# Patient Record
Sex: Male | Born: 1955 | Race: White | Hispanic: No | Marital: Single | Smoking: Current every day smoker
Health system: Southern US, Community
[De-identification: ages and names within clinical notes are randomized; demographics above are authoritative.]

## PROBLEM LIST (undated history)

## (undated) DIAGNOSIS — I639 Cerebral infarction, unspecified: Secondary | ICD-10-CM

## (undated) DIAGNOSIS — L4050 Arthropathic psoriasis, unspecified: Secondary | ICD-10-CM

## (undated) DIAGNOSIS — K219 Gastro-esophageal reflux disease without esophagitis: Secondary | ICD-10-CM

## (undated) DIAGNOSIS — D369 Benign neoplasm, unspecified site: Secondary | ICD-10-CM

## (undated) DIAGNOSIS — J449 Chronic obstructive pulmonary disease, unspecified: Secondary | ICD-10-CM

## (undated) DIAGNOSIS — E785 Hyperlipidemia, unspecified: Secondary | ICD-10-CM

## (undated) DIAGNOSIS — I10 Essential (primary) hypertension: Secondary | ICD-10-CM

## (undated) DIAGNOSIS — J189 Pneumonia, unspecified organism: Secondary | ICD-10-CM

## (undated) DIAGNOSIS — E039 Hypothyroidism, unspecified: Secondary | ICD-10-CM

## (undated) HISTORY — DX: Hyperlipidemia, unspecified: E78.5

## (undated) HISTORY — DX: Hypothyroidism, unspecified: E03.9

## (undated) HISTORY — DX: Chronic obstructive pulmonary disease, unspecified: J44.9

## (undated) HISTORY — DX: Arthropathic psoriasis, unspecified: L40.50

## (undated) HISTORY — DX: Essential (primary) hypertension: I10

## (undated) HISTORY — DX: Gastro-esophageal reflux disease without esophagitis: K21.9

## (undated) HISTORY — DX: Cerebral infarction, unspecified: I63.9

## (undated) HISTORY — DX: Benign neoplasm, unspecified site: D36.9

## (undated) HISTORY — DX: Pneumonia, unspecified organism: J18.9

## (undated) HISTORY — PX: OTHER SURGICAL HISTORY: SHX169

---

## 1999-05-05 ENCOUNTER — Encounter: Admission: RE | Admit: 1999-05-05 | Discharge: 1999-05-05 | Payer: Self-pay | Admitting: Internal Medicine

## 1999-05-05 ENCOUNTER — Encounter: Payer: Self-pay | Admitting: Internal Medicine

## 1999-05-08 ENCOUNTER — Encounter: Payer: Self-pay | Admitting: Internal Medicine

## 1999-05-08 ENCOUNTER — Encounter: Admission: RE | Admit: 1999-05-08 | Discharge: 1999-05-08 | Payer: Self-pay | Admitting: Internal Medicine

## 1999-05-19 ENCOUNTER — Encounter: Payer: Self-pay | Admitting: Internal Medicine

## 1999-05-19 ENCOUNTER — Encounter: Admission: RE | Admit: 1999-05-19 | Discharge: 1999-05-19 | Payer: Self-pay | Admitting: Internal Medicine

## 1999-06-23 ENCOUNTER — Encounter: Payer: Self-pay | Admitting: Internal Medicine

## 1999-06-23 ENCOUNTER — Encounter: Admission: RE | Admit: 1999-06-23 | Discharge: 1999-06-23 | Payer: Self-pay | Admitting: Internal Medicine

## 2001-06-27 ENCOUNTER — Encounter: Payer: Self-pay | Admitting: Internal Medicine

## 2001-06-27 ENCOUNTER — Encounter: Admission: RE | Admit: 2001-06-27 | Discharge: 2001-06-27 | Payer: Self-pay | Admitting: Internal Medicine

## 2002-08-30 ENCOUNTER — Encounter: Admission: RE | Admit: 2002-08-30 | Discharge: 2002-08-30 | Payer: Self-pay | Admitting: Internal Medicine

## 2002-08-30 ENCOUNTER — Encounter: Payer: Self-pay | Admitting: Internal Medicine

## 2003-12-04 ENCOUNTER — Encounter: Admission: RE | Admit: 2003-12-04 | Discharge: 2003-12-04 | Payer: Self-pay | Admitting: Internal Medicine

## 2005-08-10 ENCOUNTER — Ambulatory Visit: Payer: Self-pay | Admitting: Internal Medicine

## 2005-08-24 ENCOUNTER — Encounter: Payer: Self-pay | Admitting: Internal Medicine

## 2005-08-24 ENCOUNTER — Ambulatory Visit: Payer: Self-pay | Admitting: Internal Medicine

## 2008-01-23 ENCOUNTER — Ambulatory Visit: Payer: Self-pay | Admitting: Internal Medicine

## 2008-07-30 ENCOUNTER — Ambulatory Visit: Payer: Self-pay | Admitting: Internal Medicine

## 2009-01-03 ENCOUNTER — Ambulatory Visit: Payer: Self-pay | Admitting: Internal Medicine

## 2009-02-04 ENCOUNTER — Ambulatory Visit: Payer: Self-pay | Admitting: Internal Medicine

## 2009-02-04 ENCOUNTER — Encounter: Admission: RE | Admit: 2009-02-04 | Discharge: 2009-02-04 | Payer: Self-pay | Admitting: Internal Medicine

## 2009-08-05 ENCOUNTER — Ambulatory Visit: Payer: Self-pay | Admitting: Internal Medicine

## 2010-02-10 ENCOUNTER — Other Ambulatory Visit: Payer: 59 | Admitting: Internal Medicine

## 2010-02-17 ENCOUNTER — Encounter (INDEPENDENT_AMBULATORY_CARE_PROVIDER_SITE_OTHER): Payer: 59 | Admitting: Internal Medicine

## 2010-02-17 DIAGNOSIS — I10 Essential (primary) hypertension: Secondary | ICD-10-CM

## 2010-02-17 DIAGNOSIS — E039 Hypothyroidism, unspecified: Secondary | ICD-10-CM

## 2010-02-17 DIAGNOSIS — E785 Hyperlipidemia, unspecified: Secondary | ICD-10-CM

## 2010-08-15 ENCOUNTER — Encounter: Payer: Self-pay | Admitting: Internal Medicine

## 2010-08-18 ENCOUNTER — Other Ambulatory Visit: Payer: BC Managed Care – PPO | Admitting: Internal Medicine

## 2010-08-18 DIAGNOSIS — E039 Hypothyroidism, unspecified: Secondary | ICD-10-CM

## 2010-08-18 DIAGNOSIS — E785 Hyperlipidemia, unspecified: Secondary | ICD-10-CM

## 2010-08-19 LAB — LIPID PANEL
HDL: 45 mg/dL (ref >39)
LDL Cholesterol: 99 mg/dL (ref 0–99)
Total CHOL/HDL Ratio: 3.5 Ratio
Triglycerides: 70 mg/dL (ref <150)
VLDL: 14 mg/dL (ref 0–40)

## 2010-08-19 LAB — HEPATIC FUNCTION PANEL
Albumin: 4.4 g/dL (ref 3.5–5.2)
Total Protein: 6.7 g/dL (ref 6.0–8.3)

## 2010-08-19 LAB — TSH: TSH: 0.728 u[IU]/mL (ref 0.350–4.500)

## 2010-08-25 ENCOUNTER — Encounter: Payer: Self-pay | Admitting: Internal Medicine

## 2010-08-25 ENCOUNTER — Ambulatory Visit (INDEPENDENT_AMBULATORY_CARE_PROVIDER_SITE_OTHER): Payer: BC Managed Care – PPO | Admitting: Internal Medicine

## 2010-08-25 VITALS — BP 108/72 | HR 80 | Temp 98.4°F | Ht 65.5 in | Wt 126.0 lb

## 2010-08-25 DIAGNOSIS — E785 Hyperlipidemia, unspecified: Secondary | ICD-10-CM

## 2010-08-25 DIAGNOSIS — I10 Essential (primary) hypertension: Secondary | ICD-10-CM

## 2010-08-25 DIAGNOSIS — E039 Hypothyroidism, unspecified: Secondary | ICD-10-CM | POA: Insufficient documentation

## 2010-08-25 NOTE — Patient Instructions (Signed)
Continue same medications as previously prescribed. Return in 6 months for physical exam and fasting labs

## 2010-08-25 NOTE — Progress Notes (Signed)
  Subjective:    Patient ID: Sean Weaver, male    DOB: Apr 23, 1955, 55 y.o.   MRN: [id number]  HPI  patient with history of hypertension, hyperlipidemia, and hypothyroidism. History of erectile dysfunction treated with Cialis. In today for six-month recheck.    Review of Systems     Objective:   Physical Exam neck is supple without thyromegaly or carotid bruits; chest is clear; cardiac exam: Regular rate and rhythm, normal S1 and S2; extremities without edema        Assessment & Plan:  Hypertension  Hyperlipidemia  Hypothyroidism  Erectile dysfunction  Plan: New prescription for Cialis daily 5 mg with when necessary 1 year refill. Okay to refill Synthroid, generic Lipitor, generic Altace prn 6 months if drugstore calls. Schedule physical exam in 6 months. Fasting lipid panel, liver functions and TSH all within normal limits and reviewed with patient today

## 2010-10-04 ENCOUNTER — Other Ambulatory Visit: Payer: Self-pay | Admitting: Internal Medicine

## 2010-10-13 ENCOUNTER — Other Ambulatory Visit: Payer: Self-pay | Admitting: Internal Medicine

## 2011-03-02 ENCOUNTER — Other Ambulatory Visit: Payer: BC Managed Care – PPO | Admitting: Internal Medicine

## 2011-03-09 ENCOUNTER — Encounter: Payer: BC Managed Care – PPO | Admitting: Internal Medicine

## 2011-03-30 ENCOUNTER — Other Ambulatory Visit: Payer: BC Managed Care – PPO | Admitting: Internal Medicine

## 2011-03-30 DIAGNOSIS — E785 Hyperlipidemia, unspecified: Secondary | ICD-10-CM

## 2011-03-30 DIAGNOSIS — E039 Hypothyroidism, unspecified: Secondary | ICD-10-CM

## 2011-03-30 DIAGNOSIS — Z0000 Encounter for general adult medical examination without abnormal findings: Secondary | ICD-10-CM

## 2011-03-30 DIAGNOSIS — I10 Essential (primary) hypertension: Secondary | ICD-10-CM

## 2011-03-30 LAB — CBC WITH DIFFERENTIAL/PLATELET
Basophils Absolute: 0 10*3/uL (ref 0.0–0.1)
Lymphocytes Relative: 26 % (ref 12–46)
Lymphs Abs: 2.1 10*3/uL (ref 0.7–4.0)
Neutrophils Relative %: 64 % (ref 43–77)
Platelets: 245 10*3/uL (ref 150–400)
RBC: 5.32 MIL/uL (ref 4.22–5.81)
RDW: 14.4 % (ref 11.5–15.5)
WBC: 8 10*3/uL (ref 4.0–10.5)

## 2011-03-30 LAB — LIPID PANEL
Cholesterol: 169 mg/dL (ref 0–200)
HDL: 45 mg/dL (ref >39)
LDL Cholesterol: 113 mg/dL — ABNORMAL HIGH (ref 0–99)
Total CHOL/HDL Ratio: 3.8 ratio
Triglycerides: 55 mg/dL (ref <150)
VLDL: 11 mg/dL (ref 0–40)

## 2011-03-30 LAB — COMPREHENSIVE METABOLIC PANEL WITH GFR
ALT: 12 U/L (ref 0–53)
AST: 15 U/L (ref 0–37)
Albumin: 4.4 g/dL (ref 3.5–5.2)
Alkaline Phosphatase: 82 U/L (ref 39–117)
BUN: 21 mg/dL (ref 6–23)
CO2: 25 meq/L (ref 19–32)
Calcium: 9 mg/dL (ref 8.4–10.5)
Chloride: 105 meq/L (ref 96–112)
Creat: 1.01 mg/dL (ref 0.50–1.35)
Glucose, Bld: 91 mg/dL (ref 70–99)
Potassium: 4.9 meq/L (ref 3.5–5.3)
Sodium: 139 meq/L (ref 135–145)
Total Bilirubin: 0.5 mg/dL (ref 0.3–1.2)
Total Protein: 6.4 g/dL (ref 6.0–8.3)

## 2011-03-30 LAB — PSA: PSA: 0.87 ng/mL (ref <=4.00)

## 2011-04-06 ENCOUNTER — Ambulatory Visit (INDEPENDENT_AMBULATORY_CARE_PROVIDER_SITE_OTHER): Payer: BC Managed Care – PPO | Admitting: Internal Medicine

## 2011-04-06 ENCOUNTER — Encounter: Payer: Self-pay | Admitting: Internal Medicine

## 2011-04-06 VITALS — BP 108/68 | HR 80 | Temp 97.5°F | Ht 65.25 in | Wt 130.0 lb

## 2011-04-06 DIAGNOSIS — E785 Hyperlipidemia, unspecified: Secondary | ICD-10-CM

## 2011-04-06 DIAGNOSIS — K219 Gastro-esophageal reflux disease without esophagitis: Secondary | ICD-10-CM

## 2011-04-06 DIAGNOSIS — N529 Male erectile dysfunction, unspecified: Secondary | ICD-10-CM

## 2011-04-06 DIAGNOSIS — R768 Other specified abnormal immunological findings in serum: Secondary | ICD-10-CM

## 2011-04-06 DIAGNOSIS — Z23 Encounter for immunization: Secondary | ICD-10-CM

## 2011-04-06 DIAGNOSIS — E039 Hypothyroidism, unspecified: Secondary | ICD-10-CM

## 2011-04-06 DIAGNOSIS — D369 Benign neoplasm, unspecified site: Secondary | ICD-10-CM

## 2011-04-06 DIAGNOSIS — R894 Abnormal immunological findings in specimens from other organs, systems and tissues: Secondary | ICD-10-CM

## 2011-04-06 DIAGNOSIS — L409 Psoriasis, unspecified: Secondary | ICD-10-CM

## 2011-04-06 DIAGNOSIS — Z0000 Encounter for general adult medical examination without abnormal findings: Secondary | ICD-10-CM

## 2011-04-06 DIAGNOSIS — Z8739 Personal history of other diseases of the musculoskeletal system and connective tissue: Secondary | ICD-10-CM

## 2011-04-06 DIAGNOSIS — I10 Essential (primary) hypertension: Secondary | ICD-10-CM

## 2011-04-06 DIAGNOSIS — Z872 Personal history of diseases of the skin and subcutaneous tissue: Secondary | ICD-10-CM

## 2011-04-06 DIAGNOSIS — Z87891 Personal history of nicotine dependence: Secondary | ICD-10-CM

## 2011-04-06 DIAGNOSIS — L408 Other psoriasis: Secondary | ICD-10-CM

## 2011-04-06 LAB — POCT URINALYSIS DIPSTICK
Protein, UA: NEGATIVE
Spec Grav, UA: <=1.005
Urobilinogen, UA: NEGATIVE
pH, UA: 6.5

## 2011-05-03 DIAGNOSIS — L409 Psoriasis, unspecified: Secondary | ICD-10-CM | POA: Insufficient documentation

## 2011-05-03 DIAGNOSIS — K219 Gastro-esophageal reflux disease without esophagitis: Secondary | ICD-10-CM | POA: Insufficient documentation

## 2011-05-03 DIAGNOSIS — Z872 Personal history of diseases of the skin and subcutaneous tissue: Secondary | ICD-10-CM | POA: Insufficient documentation

## 2011-05-03 DIAGNOSIS — R768 Other specified abnormal immunological findings in serum: Secondary | ICD-10-CM | POA: Insufficient documentation

## 2011-05-03 DIAGNOSIS — N529 Male erectile dysfunction, unspecified: Secondary | ICD-10-CM | POA: Insufficient documentation

## 2011-05-03 DIAGNOSIS — Z87891 Personal history of nicotine dependence: Secondary | ICD-10-CM | POA: Insufficient documentation

## 2011-05-03 DIAGNOSIS — D369 Benign neoplasm, unspecified site: Secondary | ICD-10-CM | POA: Insufficient documentation

## 2011-05-03 NOTE — Patient Instructions (Signed)
Continue same medications and return in 6 months 
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
## 2011-05-14 ENCOUNTER — Telehealth: Payer: Self-pay | Admitting: Internal Medicine

## 2011-05-14 NOTE — Telephone Encounter (Signed)
Patient has long-standing history of smoking. He will to try Wellbutrin for smoking cessation.  We will start with Wellbutrin XL 150 mg daily. He does not want to try Chantix because of other friends of his have had adverse reactions. In 4 weeks we can increase to Wellbutrin XL 300 mg daily if tolerated. Prescription for Wellbutrin XL 150 mg #30 written

## 2011-07-27 ENCOUNTER — Ambulatory Visit (INDEPENDENT_AMBULATORY_CARE_PROVIDER_SITE_OTHER): Payer: BC Managed Care – PPO | Admitting: Internal Medicine

## 2011-07-27 ENCOUNTER — Encounter: Payer: Self-pay | Admitting: Internal Medicine

## 2011-07-27 VITALS — BP 118/74 | HR 76 | Temp 98.0°F | Ht 65.5 in | Wt 134.0 lb

## 2011-07-27 DIAGNOSIS — E785 Hyperlipidemia, unspecified: Secondary | ICD-10-CM

## 2011-07-27 DIAGNOSIS — Z872 Personal history of diseases of the skin and subcutaneous tissue: Secondary | ICD-10-CM

## 2011-07-27 DIAGNOSIS — L409 Psoriasis, unspecified: Secondary | ICD-10-CM

## 2011-07-27 DIAGNOSIS — M79674 Pain in right toe(s): Secondary | ICD-10-CM

## 2011-07-27 DIAGNOSIS — I878 Other specified disorders of veins: Secondary | ICD-10-CM

## 2011-07-27 DIAGNOSIS — Z87891 Personal history of nicotine dependence: Secondary | ICD-10-CM

## 2011-07-27 DIAGNOSIS — I10 Essential (primary) hypertension: Secondary | ICD-10-CM

## 2011-07-27 DIAGNOSIS — M79609 Pain in unspecified limb: Secondary | ICD-10-CM

## 2011-07-27 DIAGNOSIS — E039 Hypothyroidism, unspecified: Secondary | ICD-10-CM

## 2011-07-27 DIAGNOSIS — L408 Other psoriasis: Secondary | ICD-10-CM

## 2011-07-27 DIAGNOSIS — N529 Male erectile dysfunction, unspecified: Secondary | ICD-10-CM

## 2011-07-27 DIAGNOSIS — M79673 Pain in unspecified foot: Secondary | ICD-10-CM

## 2011-07-27 NOTE — Progress Notes (Signed)
  Subjective:    Patient ID: Sean Weaver, male    DOB: [birth year], 56 y.o.   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 Says it's been sore and irritated for a couple of weeks. Last week he was on vacation so he got some rest and wasn't standing on his feet. Also complaining of pain in right toe. Thinks he may have a hair splinter there. This is common with hairdresser's apparently. Tender along the dorsum of the great toe. No erythema. Says it feels like something is in his toe. He has a history of psoriasis and psoriatic arthritis. Also history of hypertension, hyperlipidemia, and hypothyroidism. History of heavy smoking for years. He cut down recently to 6 cigarettes daily a Wellbutrin which is great.      Review of Systems     Objective:   Physical Exam At the insertion of right Achilles tendon, there is a spongy feeling to palpation with some tenderness. There is some discoloration along the lateral ankle that blanches. It disappears when he is reclining. Suspect this has to do with stasis. Tender dorsum of great toe lateral aspect. There is actually some point tenderness. It does appear to be slightly swollen. No redness or drainage. He is very 10. Psoriasis is virtually disappeared except for area on left elbow        Assessment & Plan:  Heel pain-possible Achilles tendinitis (right)  Possible foreign body in great toe-(right)  Venous stasis  History of psoriasis   History of psoriatic arthritis  History of smoking  Hypothyroidism  Hypertension   Hyperlipidemia  Plan: For tendinitis was given Sterapred DS 10 mg 6 day dosepak. For possible foreign body in toe, given Cefzil 500 mg twice daily for 10 days. He is to soak toe and ankle in warm Epson salts water 20 minutes daily. If pain in toe persists, consider limited MRI. If pain in heel persists, refer to orthopedist.

## 2011-07-27 NOTE — Patient Instructions (Addendum)
Soak right foot in warm Epsom salts water 20 minutes daily. Take Sterapred dosepak as prescribed for 6 days. Take Cefzil 500 mg twice daily for 10 days. Call if not better in 2 weeks or sooner if worse.

## 2011-08-03 ENCOUNTER — Encounter (HOSPITAL_COMMUNITY): Payer: Self-pay | Admitting: Emergency Medicine

## 2011-08-03 ENCOUNTER — Emergency Department (HOSPITAL_COMMUNITY)
Admission: EM | Admit: 2011-08-03 | Discharge: 2011-08-03 | Disposition: A | Discharge status: Home / Self Care | Payer: BC Managed Care – PPO | Attending: Emergency Medicine | Admitting: Emergency Medicine

## 2011-08-03 DIAGNOSIS — Y93H9 Activity, other involving exterior property and land maintenance, building and construction: Secondary | ICD-10-CM | POA: Insufficient documentation

## 2011-08-03 DIAGNOSIS — I10 Essential (primary) hypertension: Secondary | ICD-10-CM | POA: Insufficient documentation

## 2011-08-03 DIAGNOSIS — E079 Disorder of thyroid, unspecified: Secondary | ICD-10-CM | POA: Insufficient documentation

## 2011-08-03 DIAGNOSIS — S058X9A Other injuries of unspecified eye and orbit, initial encounter: Secondary | ICD-10-CM | POA: Insufficient documentation

## 2011-08-03 DIAGNOSIS — S01112A Laceration without foreign body of left eyelid and periocular area, initial encounter: Secondary | ICD-10-CM

## 2011-08-03 DIAGNOSIS — F17200 Nicotine dependence, unspecified, uncomplicated: Secondary | ICD-10-CM | POA: Insufficient documentation

## 2011-08-03 DIAGNOSIS — K219 Gastro-esophageal reflux disease without esophagitis: Secondary | ICD-10-CM | POA: Insufficient documentation

## 2011-08-03 DIAGNOSIS — E785 Hyperlipidemia, unspecified: Secondary | ICD-10-CM | POA: Insufficient documentation

## 2011-08-03 DIAGNOSIS — W268XXA Contact with other sharp object(s), not elsewhere classified, initial encounter: Secondary | ICD-10-CM | POA: Insufficient documentation

## 2011-08-03 DIAGNOSIS — Y998 Other external cause status: Secondary | ICD-10-CM | POA: Insufficient documentation

## 2011-08-03 NOTE — ED Provider Notes (Signed)
Medical screening examination/treatment/procedure(s) were performed by non-physician practitioner and as supervising physician I was immediately available for consultation/collaboration.    Nelia Shi, MD 08/03/11 [phone]

## 2011-08-03 NOTE — ED Provider Notes (Signed)
History     CSN: [id number]  Arrival date & time 08/03/11  1220   First MD Initiated Contact with Patient 08/03/11 1230      Chief Complaint  Patient presents with  . Laceration    (Consider location/radiation/quality/duration/timing/severity/associated sxs/prior treatment) HPI  56 year old male presents complaining of left-eyelid injury. Patient states he was trimming bushes this afternoon when a branch hits his left upper eyelid, causing a cut. He denies any significant pain to his eye, vision changes, or pain with eye movement. Patient is not on any blood thinner medication. He is up-to-date with his tetanus shot. He denies any significant pain to the affected site.  Past Medical History  Diagnosis Date  . GERD (gastroesophageal reflux disease)   . Hypertension   . Thyroid disease   . Hyperlipidemia   . Tubular adenoma     History reviewed. No pertinent past surgical history.  Family History  Problem Relation Age of Onset  . Cancer Mother   . Cancer Father   . Hypertension Sister     History  Substance Use Topics  . Smoking status: Current Everyday Smoker  . Smokeless tobacco: Not on file  . Alcohol Use: Yes     rarely      Review of Systems  Constitutional: Negative for fever.  Eyes: Negative for photophobia, pain, discharge, redness and visual disturbance.  Skin: Positive for wound.    Allergies  Aspirin and Ibuprofen  Home Medications   Current Outpatient Rx  Name Route Sig Dispense Refill  . ATORVASTATIN CALCIUM 20 MG PO TABS Oral Take 20 mg by mouth daily.      . BUPROPION HCL ER (SR) 150 MG PO TB12 Oral Take 150 mg by mouth every other day.     Marland Kitchen CEFPROZIL 500 MG PO TABS Oral Take 500 mg by mouth 2 (two) times daily.    Marland Kitchen PREDNISONE 10 MG PO TABS Oral Take 10 mg by mouth daily.    Marland Kitchen RAMIPRIL 2.5 MG PO CAPS Oral Take 2.5 mg by mouth daily.    Marland Kitchen TADALAFIL 20 MG PO TABS Oral Take 20 mg by mouth daily as needed. Erectile dysfunction      BP  137/69  Pulse 72  Temp 99 F (37.2 C) (Oral)  Resp 20  SpO2 98%  Physical Exam  Nursing note and vitals reviewed. Constitutional: He is oriented to person, place, and time. He appears well-developed and well-nourished. No distress.  HENT:  Head: Normocephalic.  Mouth/Throat: Oropharynx is clear and moist.  Eyes: Conjunctivae and EOM are normal. Pupils are equal, round, and reactive to light. No foreign bodies found. Right eye exhibits no chemosis, no discharge and no exudate. No foreign body present in the right eye. Left eye exhibits no chemosis, no discharge and no exudate. No foreign body present in the left eye. No scleral icterus.    Neck: Normal range of motion. Neck supple.  Neurological: He is alert and oriented to person, place, and time.  Skin: Skin is warm. No rash noted.  Psychiatric: He has a normal mood and affect.    ED Course  Procedures (including critical care time)  Labs Reviewed - No data to display No results found.   No diagnosis found.  LACERATION REPAIR Performed by: Fayrene Helper Authorized byFayrene Helper Consent: Verbal consent obtained. Risks and benefits: risks, benefits and alternatives were discussed Consent given by: patient Patient identity confirmed: provided demographic data Prepped and Draped in normal sterile fashion Wound explored  Laceration  Location: L upper eyelid  Laceration Length: 1cm  No Foreign Bodies seen or palpated  Anesthesia: n/a  Local anesthetic: none  Anesthetic total: n/a  Irrigation method: q-tip Amount of cleaning: standard  Skin closure: delay  Number of sutures: n/a  Technique: bacitracin applied  Patient tolerance: Patient tolerated the procedure well with no immediate complications.  1. Left upper eye lid laceration.    MDM  Superficial laceration to L upper eyelid without eye involvement.  Since pt blink often and the wound is not actively bleeding, we opted to clean wound, apply bacitracin  and allow for it to heal on its own.  No dermabond, sterile strip or suture used.  Pt amenable to plan.          Fayrene Helper, PA-C 08/03/11 1326

## 2011-08-03 NOTE — ED Notes (Signed)
Pt presenting to ed with c/o laceration to left eye. Pt states he was out doing yard work and a limb cut him above his left eye. Pt denies blurred vision, nausea and vomiting at this time

## 2011-08-13 ENCOUNTER — Encounter: Payer: Self-pay | Admitting: Internal Medicine

## 2011-08-13 ENCOUNTER — Ambulatory Visit (INDEPENDENT_AMBULATORY_CARE_PROVIDER_SITE_OTHER): Payer: BC Managed Care – PPO | Admitting: Internal Medicine

## 2011-08-13 VITALS — BP 112/76 | HR 80 | Ht 65.5 in | Wt 134.0 lb

## 2011-08-13 DIAGNOSIS — S058X9A Other injuries of unspecified eye and orbit, initial encounter: Secondary | ICD-10-CM

## 2011-08-13 DIAGNOSIS — S01119A Laceration without foreign body of unspecified eyelid and periocular area, initial encounter: Secondary | ICD-10-CM

## 2011-09-03 ENCOUNTER — Encounter: Payer: Self-pay | Admitting: Internal Medicine

## 2011-09-03 NOTE — Progress Notes (Signed)
  Subjective:    Patient ID: TIERNAN SUTO, male    DOB: 01/03/1956, 56 y.o.   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 Had no loss of consciousness. No head injury. Patient called here and was referred to emergency department because it was felt he might have and orbit injury and would need for emergency assistance. However the laceration proved to be superficial. He required sutures and orbit was not injured.    Review of Systems     Objective:   Physical Exam healing superficial laceration upper eyelid without evidence of secondary infection        Assessment & Plan:  Superficial eyelid laceration  Hypertension-stable  Hyperlipidemia-being treated  Hypothyroidism-being treated  Plan: Return for routine health maintenance as previously scheduled. Continue to treat healing laceration with peroxide twice daily and antibiotic ointment until healed. Tetanus immunization is up-to-date.

## 2011-09-03 NOTE — Patient Instructions (Addendum)
Continue to clean laceration with peroxide and apply triple antibiotic ointment until healed

## 2011-09-24 ENCOUNTER — Encounter: Payer: Self-pay | Admitting: Internal Medicine

## 2011-10-12 ENCOUNTER — Encounter: Payer: Self-pay | Admitting: Internal Medicine

## 2011-10-12 ENCOUNTER — Ambulatory Visit (INDEPENDENT_AMBULATORY_CARE_PROVIDER_SITE_OTHER): Payer: BC Managed Care – PPO | Admitting: Internal Medicine

## 2011-10-12 ENCOUNTER — Other Ambulatory Visit: Payer: Self-pay | Admitting: Internal Medicine

## 2011-10-12 VITALS — BP 106/74 | HR 80 | Temp 98.0°F | Wt 133.5 lb

## 2011-10-12 DIAGNOSIS — R768 Other specified abnormal immunological findings in serum: Secondary | ICD-10-CM

## 2011-10-12 DIAGNOSIS — Z8739 Personal history of other diseases of the musculoskeletal system and connective tissue: Secondary | ICD-10-CM

## 2011-10-12 DIAGNOSIS — Z87891 Personal history of nicotine dependence: Secondary | ICD-10-CM

## 2011-10-12 DIAGNOSIS — I10 Essential (primary) hypertension: Secondary | ICD-10-CM

## 2011-10-12 DIAGNOSIS — Z23 Encounter for immunization: Secondary | ICD-10-CM

## 2011-10-12 DIAGNOSIS — Z79899 Other long term (current) drug therapy: Secondary | ICD-10-CM

## 2011-10-12 DIAGNOSIS — J069 Acute upper respiratory infection, unspecified: Secondary | ICD-10-CM

## 2011-10-12 DIAGNOSIS — E039 Hypothyroidism, unspecified: Secondary | ICD-10-CM

## 2011-10-12 DIAGNOSIS — E785 Hyperlipidemia, unspecified: Secondary | ICD-10-CM

## 2011-10-12 DIAGNOSIS — N529 Male erectile dysfunction, unspecified: Secondary | ICD-10-CM

## 2011-10-12 DIAGNOSIS — Z872 Personal history of diseases of the skin and subcutaneous tissue: Secondary | ICD-10-CM

## 2011-10-12 DIAGNOSIS — R894 Abnormal immunological findings in specimens from other organs, systems and tissues: Secondary | ICD-10-CM

## 2011-10-12 LAB — TSH: TSH: 0.64 u[IU]/mL (ref 0.350–4.500)

## 2011-10-12 LAB — LIPID PANEL
LDL Cholesterol: 114 mg/dL — ABNORMAL HIGH (ref 0–99)
Total CHOL/HDL Ratio: 3.6 Ratio
Triglycerides: 49 mg/dL (ref <150)
VLDL: 10 mg/dL (ref 0–40)

## 2011-10-12 LAB — HEPATIC FUNCTION PANEL
Indirect Bilirubin: 0.6 mg/dL (ref 0.0–0.9)
Total Protein: 6.6 g/dL (ref 6.0–8.3)

## 2011-10-12 NOTE — Patient Instructions (Addendum)
Continue same medications. Zithromax Z-PAK as been prescribed for respiratory infection symptoms. Return in 6 months. Lab work is pending

## 2011-10-12 NOTE — Progress Notes (Signed)
  Subjective:    Patient ID: Sean Weaver, male    DOB: July 02, 1955, 56 y.o.   MRN: [id number]  HPI 56 year old white male with history of hypertension, hypothyroidism, hyperlipidemia in today for six-month recheck. History of erectile dysfunction and uses either Viagra or Cialis but these tend to cause a headache. History of heavy smoking. Not ready to quit. Fasting lipid panel liver functions drawn today along with TSH. He is on statin medication. Influenza immunization given today.  Today he has new complaint of sore throat and URI symptoms with postnasal drip. No fever or shaking chills.  He works as a Retail buyer at [employer]. Lives for long-term male partner of many years.  History of colonoscopy in 2007 with history of adenomatous colon polyp. Hepatitis B surface antibody positive. No evidence of chronic hepatitis B infection. History of psoriatic arthritis. Takes Wellbutrin to help with smoking cessation efforts.     Review of Systems     Objective:   Physical Exam pharynx is red without exudate. TMs are clear bilaterally. Neck is supple without thyromegaly or adenopathy. Chest clear to auscultation. Cardiac exam regular rate and rhythm. Extremities without edema. Skin is warm and dry. No hepatosplenomegaly masses or tenderness        Assessment & Plan:  URI-treat with Zithromax Z-PAK take 2 tablets by mouth day one followed by 1 tablet by mouth days 2 through 5  Hypothyroidism TSH is drawn and is pending on levothyroxin 0.1 mg daily  Hyperlipidemia-fasting lipid panel liver functions drawn on Lipitor generic  Hypertension-stable on Ramipril  History of smoking-not ready to quit  History of psoriatic arthritis-stable and not active at present time  History of psoriasis  History of hepatitis B surface antibody positive with no evidence of chronic hepatitis.  History of adenomatous colon polyp  History of erectile dysfunction-prescription  written for sample of Cialis 5 mg daily #30  Plan: Return in 6 months for physical examination. Influenza immunization given today.

## 2011-10-12 NOTE — Addendum Note (Signed)
Addended by: Judy Pimple on: 10/12/2011 11:46 AM   Modules accepted: Orders

## 2012-01-03 ENCOUNTER — Other Ambulatory Visit: Payer: Self-pay | Admitting: Internal Medicine

## 2012-05-09 ENCOUNTER — Ambulatory Visit (INDEPENDENT_AMBULATORY_CARE_PROVIDER_SITE_OTHER): Payer: PRIVATE HEALTH INSURANCE | Admitting: Internal Medicine

## 2012-05-09 ENCOUNTER — Encounter: Payer: Self-pay | Admitting: Internal Medicine

## 2012-05-09 VITALS — BP 114/76 | HR 80 | Temp 97.8°F | Ht 65.5 in | Wt 138.5 lb

## 2012-05-09 DIAGNOSIS — E039 Hypothyroidism, unspecified: Secondary | ICD-10-CM

## 2012-05-09 DIAGNOSIS — I10 Essential (primary) hypertension: Secondary | ICD-10-CM

## 2012-05-09 DIAGNOSIS — E785 Hyperlipidemia, unspecified: Secondary | ICD-10-CM

## 2012-05-09 DIAGNOSIS — L409 Psoriasis, unspecified: Secondary | ICD-10-CM

## 2012-05-09 DIAGNOSIS — Z125 Encounter for screening for malignant neoplasm of prostate: Secondary | ICD-10-CM

## 2012-05-09 DIAGNOSIS — Z87891 Personal history of nicotine dependence: Secondary | ICD-10-CM

## 2012-05-09 DIAGNOSIS — Z79899 Other long term (current) drug therapy: Secondary | ICD-10-CM

## 2012-05-09 DIAGNOSIS — K219 Gastro-esophageal reflux disease without esophagitis: Secondary | ICD-10-CM

## 2012-05-09 DIAGNOSIS — L408 Other psoriasis: Secondary | ICD-10-CM

## 2012-05-09 LAB — POCT URINALYSIS DIPSTICK
Ketones, UA: NEGATIVE
Protein, UA: NEGATIVE
Spec Grav, UA: 1.02
pH, UA: 6

## 2012-05-09 LAB — CBC WITH DIFFERENTIAL/PLATELET
Basophils Absolute: 0 10*3/uL (ref 0.0–0.1)
Eosinophils Relative: 1 % (ref 0–5)
Lymphocytes Relative: 25 % (ref 12–46)
Lymphs Abs: 1.6 10*3/uL (ref 0.7–4.0)
MCV: 82 fL (ref 78.0–100.0)
Neutro Abs: 4.1 10*3/uL (ref 1.7–7.7)
Neutrophils Relative %: 63 % (ref 43–77)
Platelets: 254 10*3/uL (ref 150–400)
RBC: 5.21 MIL/uL (ref 4.22–5.81)
WBC: 6.5 10*3/uL (ref 4.0–10.5)

## 2012-05-09 LAB — LIPID PANEL: Cholesterol: 176 mg/dL (ref 0–200)

## 2012-05-09 LAB — TSH: TSH: 0.664 u[IU]/mL (ref 0.350–4.500)

## 2012-05-09 LAB — COMPREHENSIVE METABOLIC PANEL
ALT: 16 U/L (ref 0–53)
AST: 14 U/L (ref 0–37)
CO2: 23 mEq/L (ref 19–32)
Calcium: 9 mg/dL (ref 8.4–10.5)
Chloride: 105 mEq/L (ref 96–112)
Potassium: 4.4 mEq/L (ref 3.5–5.3)
Sodium: 140 mEq/L (ref 135–145)
Total Protein: 6.7 g/dL (ref 6.0–8.3)

## 2012-05-09 LAB — PSA: PSA: 1.11 ng/mL (ref <=4.00)

## 2012-05-09 MED ORDER — RAMIPRIL 2.5 MG PO CAPS: ORAL_CAPSULE | ORAL | Status: DC

## 2012-05-09 MED ORDER — ATORVASTATIN CALCIUM 20 MG PO TABS: 20.0000 mg | ORAL_TABLET | Freq: Every day | ORAL | Status: DC

## 2012-05-09 MED ORDER — SYNTHROID 100 MCG PO TABS: ORAL_TABLET | ORAL | Status: DC

## 2012-05-09 NOTE — Patient Instructions (Addendum)
Continue same medications and return in 6 months 
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
## 2012-05-09 NOTE — Addendum Note (Signed)
Addended by: Judy Pimple on: 05/09/2012 11:01 AM   Modules accepted: Orders

## 2012-08-29 ENCOUNTER — Other Ambulatory Visit: Payer: Self-pay | Admitting: Internal Medicine

## 2012-09-27 ENCOUNTER — Other Ambulatory Visit: Payer: Self-pay | Admitting: Internal Medicine

## 2012-10-10 ENCOUNTER — Ambulatory Visit (INDEPENDENT_AMBULATORY_CARE_PROVIDER_SITE_OTHER): Payer: PRIVATE HEALTH INSURANCE | Admitting: Internal Medicine

## 2012-10-10 DIAGNOSIS — Z23 Encounter for immunization: Secondary | ICD-10-CM

## 2012-10-31 ENCOUNTER — Other Ambulatory Visit: Payer: BC Managed Care – PPO | Admitting: Internal Medicine

## 2012-10-31 DIAGNOSIS — Z79899 Other long term (current) drug therapy: Secondary | ICD-10-CM

## 2012-10-31 DIAGNOSIS — E039 Hypothyroidism, unspecified: Secondary | ICD-10-CM

## 2012-10-31 DIAGNOSIS — E785 Hyperlipidemia, unspecified: Secondary | ICD-10-CM

## 2012-10-31 LAB — LIPID PANEL
Cholesterol: 183 mg/dL (ref 0–200)
Total CHOL/HDL Ratio: 3.9 Ratio
VLDL: 13 mg/dL (ref 0–40)

## 2012-10-31 LAB — HEPATIC FUNCTION PANEL
ALT: 31 U/L (ref 0–53)
Bilirubin, Direct: 0.1 mg/dL (ref 0.0–0.3)

## 2012-10-31 LAB — TSH: TSH: 0.759 u[IU]/mL (ref 0.350–4.500)

## 2012-11-07 ENCOUNTER — Ambulatory Visit (INDEPENDENT_AMBULATORY_CARE_PROVIDER_SITE_OTHER): Payer: PRIVATE HEALTH INSURANCE | Admitting: Internal Medicine

## 2012-11-07 ENCOUNTER — Encounter: Payer: Self-pay | Admitting: Internal Medicine

## 2012-11-07 VITALS — BP 126/74 | HR 72 | Temp 98.2°F | Ht 65.0 in | Wt 142.0 lb

## 2012-11-07 DIAGNOSIS — I10 Essential (primary) hypertension: Secondary | ICD-10-CM

## 2012-11-07 DIAGNOSIS — E785 Hyperlipidemia, unspecified: Secondary | ICD-10-CM

## 2012-11-07 DIAGNOSIS — E039 Hypothyroidism, unspecified: Secondary | ICD-10-CM

## 2012-11-07 MED ORDER — ATORVASTATIN CALCIUM 20 MG PO TABS: 40.0000 mg | ORAL_TABLET | Freq: Every day | ORAL | Status: DC

## 2012-11-07 MED ORDER — RAMIPRIL 2.5 MG PO CAPS: ORAL_CAPSULE | ORAL | Status: DC

## 2012-11-07 MED ORDER — SYNTHROID 100 MCG PO TABS: ORAL_TABLET | ORAL | Status: DC

## 2012-11-07 NOTE — Patient Instructions (Signed)
Increase Lipitor to 40 mg daily and return in 6 months for PE

## 2012-11-07 NOTE — Progress Notes (Signed)
  Subjective:    Patient ID: TAMAR LIPSCOMB, male    DOB: 1955/05/14, 57 y.o.   MRN: [id number]  HPI  57 year old White Male for 6 month recheck.  Has elevated LDL at 123 up from 116 last time 6 months ago., TSH is normal on thyroid replacement. Has had flu vaccine. No  New complaints. BP is excellent on current regimen.    Review of Systems     Objective:   Physical Exam skin is warm and dry. Nodes none. HEENT exam: TMs and pharynx are clear. Neck is supple without adenopathy or thyromegaly. Chest clear to auscultation. Cardiac exam regular rate and rhythm normal S1 and S2.        Assessment & Plan:  Hypothyroidism-TSH normal on thyroid replacement- continue same dose  Hyperlipidemia-increase Lipitor from 20-40 mg daily and recheck in 6 months  Hypertension-stable on low-dose Ramipril  Plan: Return in 6 months for physical examination.

## 2012-11-28 ENCOUNTER — Other Ambulatory Visit: Payer: Self-pay | Admitting: *Deleted

## 2012-11-28 DIAGNOSIS — E785 Hyperlipidemia, unspecified: Secondary | ICD-10-CM

## 2012-11-28 MED ORDER — ATORVASTATIN CALCIUM 40 MG PO TABS: 40.0000 mg | ORAL_TABLET | Freq: Every day | ORAL | Status: DC

## 2013-03-23 ENCOUNTER — Other Ambulatory Visit: Payer: Self-pay | Admitting: Internal Medicine

## 2013-05-15 ENCOUNTER — Encounter: Payer: Self-pay | Admitting: Internal Medicine

## 2013-05-15 ENCOUNTER — Ambulatory Visit (INDEPENDENT_AMBULATORY_CARE_PROVIDER_SITE_OTHER): Payer: BC Managed Care – PPO | Admitting: Internal Medicine

## 2013-05-15 VITALS — BP 128/80 | HR 92 | Temp 99.0°F | Ht 65.5 in | Wt 143.5 lb

## 2013-05-15 DIAGNOSIS — Z860100 Personal history of colon polyps, unspecified: Secondary | ICD-10-CM

## 2013-05-15 DIAGNOSIS — Z130 Encounter for screening for diseases of the blood and blood-forming organs and certain disorders involving the immune mechanism: Secondary | ICD-10-CM

## 2013-05-15 DIAGNOSIS — E785 Hyperlipidemia, unspecified: Secondary | ICD-10-CM

## 2013-05-15 DIAGNOSIS — K219 Gastro-esophageal reflux disease without esophagitis: Secondary | ICD-10-CM

## 2013-05-15 DIAGNOSIS — Z0000 Encounter for general adult medical examination without abnormal findings: Secondary | ICD-10-CM

## 2013-05-15 DIAGNOSIS — L408 Other psoriasis: Secondary | ICD-10-CM

## 2013-05-15 DIAGNOSIS — Z125 Encounter for screening for malignant neoplasm of prostate: Secondary | ICD-10-CM

## 2013-05-15 DIAGNOSIS — I10 Essential (primary) hypertension: Secondary | ICD-10-CM

## 2013-05-15 DIAGNOSIS — L409 Psoriasis, unspecified: Secondary | ICD-10-CM

## 2013-05-15 DIAGNOSIS — E039 Hypothyroidism, unspecified: Secondary | ICD-10-CM

## 2013-05-15 LAB — LIPID PANEL
Cholesterol: 182 mg/dL (ref 0–200)
HDL: 51 mg/dL (ref >39)
LDL CALC: 118 mg/dL — AB (ref 0–99)
Total CHOL/HDL Ratio: 3.6 Ratio
Triglycerides: 67 mg/dL (ref <150)
VLDL: 13 mg/dL (ref 0–40)

## 2013-05-15 LAB — CBC WITH DIFFERENTIAL/PLATELET
BASOS ABS: 0.1 10*3/uL (ref 0.0–0.1)
BASOS PCT: 1 % (ref 0–1)
Eosinophils Absolute: 0.1 10*3/uL (ref 0.0–0.7)
Eosinophils Relative: 2 % (ref 0–5)
HCT: 45.7 % (ref 39.0–52.0)
HEMOGLOBIN: 15.9 g/dL (ref 13.0–17.0)
Lymphocytes Relative: 25 % (ref 12–46)
Lymphs Abs: 1.8 10*3/uL (ref 0.7–4.0)
MCH: 28.4 pg (ref 26.0–34.0)
MCHC: 34.8 g/dL (ref 30.0–36.0)
MCV: 81.6 fL (ref 78.0–100.0)
Monocytes Absolute: 0.6 10*3/uL (ref 0.1–1.0)
Monocytes Relative: 9 % (ref 3–12)
NEUTROS ABS: 4.5 10*3/uL (ref 1.7–7.7)
Neutrophils Relative %: 63 % (ref 43–77)
Platelets: 262 10*3/uL (ref 150–400)
RBC: 5.6 MIL/uL (ref 4.22–5.81)
RDW: 14.7 % (ref 11.5–15.5)
WBC: 7.2 10*3/uL (ref 4.0–10.5)

## 2013-05-15 LAB — POCT URINALYSIS DIPSTICK
BILIRUBIN UA: NEGATIVE
Blood, UA: NEGATIVE
Glucose, UA: NEGATIVE
KETONES UA: NEGATIVE
LEUKOCYTES UA: NEGATIVE
Nitrite, UA: NEGATIVE
PROTEIN UA: NEGATIVE
Spec Grav, UA: 1.01
Urobilinogen, UA: NEGATIVE
pH, UA: 6

## 2013-05-15 LAB — COMPREHENSIVE METABOLIC PANEL
ALK PHOS: 89 U/L (ref 39–117)
ALT: 25 U/L (ref 0–53)
AST: 19 U/L (ref 0–37)
Albumin: 4.4 g/dL (ref 3.5–5.2)
BUN: 14 mg/dL (ref 6–23)
CALCIUM: 9.5 mg/dL (ref 8.4–10.5)
CO2: 25 mEq/L (ref 19–32)
Chloride: 103 mEq/L (ref 96–112)
Creat: 1.1 mg/dL (ref 0.50–1.35)
Glucose, Bld: 105 mg/dL — ABNORMAL HIGH (ref 70–99)
Potassium: 4.6 mEq/L (ref 3.5–5.3)
SODIUM: 138 meq/L (ref 135–145)
TOTAL PROTEIN: 6.8 g/dL (ref 6.0–8.3)
Total Bilirubin: 0.7 mg/dL (ref 0.2–1.2)

## 2013-05-15 LAB — TSH: TSH: 1.927 u[IU]/mL (ref 0.350–4.500)
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
## 2013-05-15 NOTE — Patient Instructions (Addendum)
Continue same medications and return in 6 months. Check with Dr. Olevia Perches regarding repeat colonoscopy.

## 2013-05-15 NOTE — Addendum Note (Signed)
Addended by: Brett Canales on: 05/15/2013 12:04 PM   Modules accepted: Orders

## 2013-05-15 NOTE — Addendum Note (Signed)
Addended by: Brett Canales on: 05/15/2013 11:09 AM   Modules accepted: Orders

## 2013-05-16 LAB — PSA: PSA: 1.09 ng/mL (ref <=4.00)

## 2013-08-24 ENCOUNTER — Telehealth: Payer: Self-pay | Admitting: *Deleted

## 2013-08-24 NOTE — Telephone Encounter (Signed)
Spoke with patient and asked questions, prior auth sent thru cover my meds.

## 2013-08-24 NOTE — Telephone Encounter (Signed)
Left message on all pt's numbers, need to discuss with him the questions for prior auth for 5mg  of cialis.

## 2013-09-09 ENCOUNTER — Other Ambulatory Visit: Payer: Self-pay | Admitting: Internal Medicine

## 2013-09-25 ENCOUNTER — Encounter: Payer: Self-pay | Admitting: Internal Medicine

## 2013-09-25 ENCOUNTER — Ambulatory Visit (INDEPENDENT_AMBULATORY_CARE_PROVIDER_SITE_OTHER): Payer: BC Managed Care – PPO | Admitting: Internal Medicine

## 2013-09-25 VITALS — BP 110/80 | HR 84 | Wt 138.0 lb

## 2013-09-25 DIAGNOSIS — Z23 Encounter for immunization: Secondary | ICD-10-CM

## 2013-09-25 DIAGNOSIS — B029 Zoster without complications: Secondary | ICD-10-CM

## 2013-09-25 MED ORDER — MUPIROCIN 2 % EX OINT: TOPICAL_OINTMENT | CUTANEOUS | Status: DC

## 2013-09-25 MED ORDER — CEPHALEXIN 500 MG PO CAPS: 500.0000 mg | ORAL_CAPSULE | Freq: Four times a day (QID) | ORAL | Status: DC

## 2013-09-25 NOTE — Patient Instructions (Signed)
It is probably too late to take Valtrex at this point in time since should have rash for week. Take Keflex 500 mg 4 times daily for 7 days for secondary bacterial infection. Use Bactroban ointment on lesions twice daily

## 2013-09-25 NOTE — Progress Notes (Signed)
   Subjective:    Patient ID: Sean Weaver, male    DOB: 08/08/1955, 58 y.o.   MRN: [id number]  HPI  Lesions noted  left foot onset about a week ago. Located on the dorsal lateral foot. Initially were itchy and had vesicles which now has progressed to what looks to be some pustules with purpura surrounding them. Did not have insect bite to his knowledge. Has not had Zostavax vaccine although an order was given to him a while back.    Review of Systems     Objective:   Physical Exam  A few small pustular lesions left dorsolateral foot with some surrounding discrete purpura.      Assessment & Plan:  Herpes zoster left foot  Plan: It is too late to start Valtrex. Treated for secondary infection with Keflex 500 mg 4 times daily for 7 days. Keep area clean and dry. Apply Bactroban ointment twice daily until healed.

## 2013-09-26 ENCOUNTER — Ambulatory Visit: Payer: Self-pay | Admitting: Internal Medicine

## 2013-11-09 ENCOUNTER — Other Ambulatory Visit: Payer: Self-pay | Admitting: Internal Medicine

## 2013-11-09 DIAGNOSIS — E785 Hyperlipidemia, unspecified: Secondary | ICD-10-CM

## 2013-11-09 MED ORDER — ATORVASTATIN CALCIUM 40 MG PO TABS
40.0000 mg | ORAL_TABLET | Freq: Every day | ORAL | Status: DC
Start: 2013-11-09 — End: 2014-11-20

## 2013-11-10 ENCOUNTER — Other Ambulatory Visit: Payer: Self-pay | Admitting: Internal Medicine

## 2013-11-20 ENCOUNTER — Encounter: Payer: Self-pay | Admitting: Internal Medicine

## 2013-11-20 ENCOUNTER — Ambulatory Visit (INDEPENDENT_AMBULATORY_CARE_PROVIDER_SITE_OTHER): Payer: BC Managed Care – PPO | Admitting: Internal Medicine

## 2013-11-20 VITALS — BP 110/78 | HR 92 | Temp 97.7°F | Ht 65.5 in | Wt 142.0 lb

## 2013-11-20 DIAGNOSIS — Z87891 Personal history of nicotine dependence: Secondary | ICD-10-CM

## 2013-11-20 DIAGNOSIS — Z720 Tobacco use: Secondary | ICD-10-CM

## 2013-11-20 DIAGNOSIS — I10 Essential (primary) hypertension: Secondary | ICD-10-CM

## 2013-11-20 DIAGNOSIS — E039 Hypothyroidism, unspecified: Secondary | ICD-10-CM

## 2013-11-20 DIAGNOSIS — E785 Hyperlipidemia, unspecified: Secondary | ICD-10-CM

## 2013-11-20 DIAGNOSIS — Z872 Personal history of diseases of the skin and subcutaneous tissue: Secondary | ICD-10-CM

## 2013-11-20 LAB — LIPID PANEL
Cholesterol: 172 mg/dL (ref 0–200)
HDL: 51 mg/dL (ref >39)
LDL Cholesterol: 104 mg/dL — ABNORMAL HIGH (ref 0–99)
TRIGLYCERIDES: 84 mg/dL (ref <150)
Total CHOL/HDL Ratio: 3.4 Ratio
VLDL: 17 mg/dL (ref 0–40)

## 2013-11-20 LAB — HEPATIC FUNCTION PANEL
ALT: 20 U/L (ref 0–53)
AST: 19 U/L (ref 0–37)
Albumin: 4.3 g/dL (ref 3.5–5.2)
Alkaline Phosphatase: 94 U/L (ref 39–117)
Bilirubin, Direct: 0.1 mg/dL (ref 0.0–0.3)
Indirect Bilirubin: 0.6 mg/dL (ref 0.2–1.2)
TOTAL PROTEIN: 7 g/dL (ref 6.0–8.3)
Total Bilirubin: 0.7 mg/dL (ref 0.2–1.2)

## 2013-11-20 LAB — TSH: TSH: 0.846 u[IU]/mL (ref 0.350–4.500)

## 2013-11-20 NOTE — Progress Notes (Signed)
   Subjective:    Patient ID: Sean Weaver, male    DOB: May 16, 1955, 58 y.o.   MRN: [id number]  HPI  58 year old male in today for six-month recheck. History of hypertension, hypothyroidism, hyperlipidemia. Compliant with medications. Feels well with no complaints. Blood pressures under excellent control today. No new problems. History of psoriasis.    Review of Systems     Objective:   Physical Exam  TMs and pharynx are clear. Neck is supple without JVD thyromegaly or carotid bruits. Chest clear to auscultation. Cardiac exam regular rate and rhythm normal S1 and S2. Extremities without edema. He has some seborrhea right external ear.      Assessment & Plan:  Hypertension-stable on current regimen  Hyperlipidemia -treated with statin.Fasting lipid panel pending  Hypothyroidism-TSH drawn and pending  Seborrhea right external ear canal-to try hydrocortisone cream 3 times weekly topically  Plan: Continue same medications and return in 6 months. Labs are pending and will be reviewed.

## 2013-11-20 NOTE — Patient Instructions (Signed)
Continue same medications and return in 6 months for physical examination. 

## 2014-03-01 ENCOUNTER — Other Ambulatory Visit: Payer: Self-pay | Admitting: Internal Medicine

## 2014-04-02 ENCOUNTER — Other Ambulatory Visit: Payer: Self-pay | Admitting: *Deleted

## 2014-04-02 ENCOUNTER — Telehealth: Payer: Self-pay | Admitting: Internal Medicine

## 2014-04-02 MED ORDER — ALPRAZOLAM 0.5 MG PO TABS: 0.5000 mg | ORAL_TABLET | Freq: Two times a day (BID) | ORAL | Status: DC | PRN

## 2014-04-02 NOTE — Telephone Encounter (Signed)
Patiently currently having relationship issues. Asking for antianxiety medication.: Xanax 0.5 mg 1 by mouth twice daily as needed for anxiety. No refill.

## 2014-05-21 ENCOUNTER — Encounter: Payer: Self-pay | Admitting: Internal Medicine

## 2014-05-21 ENCOUNTER — Ambulatory Visit (INDEPENDENT_AMBULATORY_CARE_PROVIDER_SITE_OTHER): Payer: BLUE CROSS/BLUE SHIELD | Admitting: Internal Medicine

## 2014-05-21 VITALS — BP 110/74 | HR 89 | Temp 98.1°F | Ht 66.0 in | Wt 144.0 lb

## 2014-05-21 DIAGNOSIS — E785 Hyperlipidemia, unspecified: Secondary | ICD-10-CM | POA: Diagnosis not present

## 2014-05-21 DIAGNOSIS — R894 Abnormal immunological findings in specimens from other organs, systems and tissues: Secondary | ICD-10-CM | POA: Diagnosis not present

## 2014-05-21 DIAGNOSIS — E039 Hypothyroidism, unspecified: Secondary | ICD-10-CM | POA: Diagnosis not present

## 2014-05-21 DIAGNOSIS — R768 Other specified abnormal immunological findings in serum: Secondary | ICD-10-CM

## 2014-05-21 DIAGNOSIS — I10 Essential (primary) hypertension: Secondary | ICD-10-CM

## 2014-05-21 DIAGNOSIS — Z720 Tobacco use: Secondary | ICD-10-CM | POA: Diagnosis not present

## 2014-05-21 DIAGNOSIS — Z87891 Personal history of nicotine dependence: Secondary | ICD-10-CM

## 2014-05-21 DIAGNOSIS — L409 Psoriasis, unspecified: Secondary | ICD-10-CM

## 2014-05-21 DIAGNOSIS — D126 Benign neoplasm of colon, unspecified: Secondary | ICD-10-CM

## 2014-05-21 DIAGNOSIS — L218 Other seborrheic dermatitis: Secondary | ICD-10-CM | POA: Diagnosis not present

## 2014-05-21 DIAGNOSIS — Z13220 Encounter for screening for lipoid disorders: Secondary | ICD-10-CM | POA: Diagnosis not present

## 2014-05-21 DIAGNOSIS — Z0000 Encounter for general adult medical examination without abnormal findings: Secondary | ICD-10-CM | POA: Diagnosis not present

## 2014-05-21 DIAGNOSIS — Z125 Encounter for screening for malignant neoplasm of prostate: Secondary | ICD-10-CM

## 2014-05-21 DIAGNOSIS — N529 Male erectile dysfunction, unspecified: Secondary | ICD-10-CM

## 2014-05-21 LAB — CBC WITH DIFFERENTIAL/PLATELET
BASOS ABS: 0.1 10*3/uL (ref 0.0–0.1)
Basophils Relative: 1 % (ref 0–1)
EOS ABS: 0.1 10*3/uL (ref 0.0–0.7)
EOS PCT: 1 % (ref 0–5)
HEMATOCRIT: 48.2 % (ref 39.0–52.0)
Hemoglobin: 16.2 g/dL (ref 13.0–17.0)
Lymphocytes Relative: 22 % (ref 12–46)
Lymphs Abs: 1.5 10*3/uL (ref 0.7–4.0)
MCH: 28.3 pg (ref 26.0–34.0)
MCHC: 33.6 g/dL (ref 30.0–36.0)
MCV: 84.1 fL (ref 78.0–100.0)
MONOS PCT: 11 % (ref 3–12)
MPV: 11.9 fL (ref 8.6–12.4)
Monocytes Absolute: 0.7 10*3/uL (ref 0.1–1.0)
Neutro Abs: 4.3 10*3/uL (ref 1.7–7.7)
Neutrophils Relative %: 65 % (ref 43–77)
Platelets: 283 10*3/uL (ref 150–400)
RBC: 5.73 MIL/uL (ref 4.22–5.81)
RDW: 14.3 % (ref 11.5–15.5)
WBC: 6.6 10*3/uL (ref 4.0–10.5)

## 2014-05-21 LAB — COMPLETE METABOLIC PANEL WITH GFR
ALT: 19 U/L (ref 0–53)
AST: 15 U/L (ref 0–37)
Albumin: 4.1 g/dL (ref 3.5–5.2)
Alkaline Phosphatase: 78 U/L (ref 39–117)
BUN: 14 mg/dL (ref 6–23)
CALCIUM: 9 mg/dL (ref 8.4–10.5)
CHLORIDE: 105 meq/L (ref 96–112)
CO2: 26 meq/L (ref 19–32)
CREATININE: 1.05 mg/dL (ref 0.50–1.35)
GFR, EST NON AFRICAN AMERICAN: 76 mL/min
GFR, Est African American: 88 mL/min
Glucose, Bld: 101 mg/dL — ABNORMAL HIGH (ref 70–99)
Potassium: 4.8 mEq/L (ref 3.5–5.3)
Sodium: 139 mEq/L (ref 135–145)
Total Bilirubin: 0.7 mg/dL (ref 0.2–1.2)
Total Protein: 6.6 g/dL (ref 6.0–8.3)

## 2014-05-21 LAB — POCT URINALYSIS DIPSTICK
BILIRUBIN UA: NEGATIVE
Blood, UA: NEGATIVE
GLUCOSE UA: NEGATIVE
KETONES UA: NEGATIVE
Leukocytes, UA: NEGATIVE
Nitrite, UA: NEGATIVE
Protein, UA: NEGATIVE
Urobilinogen, UA: NEGATIVE
pH, UA: 6.5

## 2014-05-21 LAB — LIPID PANEL
CHOLESTEROL: 159 mg/dL (ref 0–200)
HDL: 41 mg/dL (ref >=40)
LDL Cholesterol: 104 mg/dL — ABNORMAL HIGH (ref 0–99)
TRIGLYCERIDES: 70 mg/dL (ref <150)
Total CHOL/HDL Ratio: 3.9 Ratio
VLDL: 14 mg/dL (ref 0–40)
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
## 2014-05-21 NOTE — Patient Instructions (Signed)
Continue same medications and return in 6 months. It was a pleasure to see you today. 

## 2014-05-22 LAB — PSA: PSA: 0.89 ng/mL (ref <=4.00)

## 2014-07-27 ENCOUNTER — Encounter: Payer: Self-pay | Admitting: Internal Medicine

## 2014-09-13 ENCOUNTER — Other Ambulatory Visit: Payer: Self-pay | Admitting: Internal Medicine

## 2014-10-01 ENCOUNTER — Ambulatory Visit (INDEPENDENT_AMBULATORY_CARE_PROVIDER_SITE_OTHER): Payer: BLUE CROSS/BLUE SHIELD | Admitting: Internal Medicine

## 2014-10-01 ENCOUNTER — Encounter: Payer: Self-pay | Admitting: Internal Medicine

## 2014-10-01 VITALS — BP 116/76 | HR 92 | Temp 97.8°F | Ht 66.0 in | Wt 143.5 lb

## 2014-10-01 DIAGNOSIS — R51 Headache: Secondary | ICD-10-CM | POA: Diagnosis not present

## 2014-10-01 DIAGNOSIS — R519 Headache, unspecified: Secondary | ICD-10-CM

## 2014-10-01 DIAGNOSIS — G44009 Cluster headache syndrome, unspecified, not intractable: Secondary | ICD-10-CM | POA: Diagnosis not present

## 2014-10-01 MED ORDER — PREDNISONE 10 MG PO TABS: ORAL_TABLET | ORAL | Status: DC

## 2014-10-01 NOTE — Progress Notes (Signed)
   Subjective:    Patient ID: Sean Weaver, male    DOB: [birth year], 59 y.o.   MRN: [id number]  HPI  Patient is here today to discuss headache symptoms. He's had 4- 5 episodes over the past month of a stabbing in his left followed by coryza of the left eye and subsequent left-sided headache. He does not know what brings these episodes on. At times he's felt dizzy even without headache. 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 Had to sit down. No prior history of headaches. No vomiting. Advil seems to help dull headache. Sometimes he prophylactically takes Advil in the morning to keep from getting one of these headaches. No apparent relationship to food intake. Says seems that he has some visual disturbance and left eye with one of these episodes.  History of smoking, hyperlipidemia, hypothyroidism, erectile dysfunction, essential hypertension    Review of Systems as above     Objective:   Physical Exam Funduscopic exam is benign. Seems to have early cataract left eye. No nystagmus. PERRLA. Deep tendon reflexes 2+ and symmetrical. Muscle strength is normal in the upper and lower extremities. Gait is normal. Head is without depressions or protrusions. No adenopathy. Chest clear. Cardiac exam regular rate and rhythm. Sensation is intact.       Assessment & Plan:  Probable cluster headaches  Plan: Sterapred DS 10 mg six-day Dosepak. CT of the brain with contrast. Sample of Relpax 40 mg to try and onset of headache. Keep headache diary and call in 2 weeks with progress report. Check CBC and sedimentation rate.  30 minutes spent with patient.

## 2014-10-01 NOTE — Patient Instructions (Signed)
Have CT of the brain with contrast. Take prednisone as directed. Trial of Relpax at onset of headache. Call with progress report in 2 weeks. Keep headache diary. CBC and sedimentation rate checked.

## 2014-10-02 ENCOUNTER — Telehealth: Payer: Self-pay | Admitting: *Deleted

## 2014-10-02 LAB — CBC WITH DIFFERENTIAL/PLATELET
Basophils Absolute: 0.1 10*3/uL (ref 0.0–0.1)
Basophils Relative: 1 % (ref 0–1)
EOS ABS: 0.2 10*3/uL (ref 0.0–0.7)
EOS PCT: 2 % (ref 0–5)
HEMATOCRIT: 44.3 % (ref 39.0–52.0)
Hemoglobin: 15.7 g/dL (ref 13.0–17.0)
LYMPHS ABS: 2 10*3/uL (ref 0.7–4.0)
LYMPHS PCT: 25 % (ref 12–46)
MCH: 29.1 pg (ref 26.0–34.0)
MCHC: 35.4 g/dL (ref 30.0–36.0)
MCV: 82.2 fL (ref 78.0–100.0)
MONOS PCT: 10 % (ref 3–12)
MPV: 11.4 fL (ref 8.6–12.4)
Monocytes Absolute: 0.8 10*3/uL (ref 0.1–1.0)
NEUTROS PCT: 62 % (ref 43–77)
Neutro Abs: 4.8 10*3/uL (ref 1.7–7.7)
PLATELETS: 253 10*3/uL (ref 150–400)
RBC: 5.39 MIL/uL (ref 4.22–5.81)
RDW: 14.2 % (ref 11.5–15.5)
WBC: 7.8 10*3/uL (ref 4.0–10.5)

## 2014-10-02 LAB — SEDIMENTATION RATE: Sed Rate: 1 mm/hr (ref 0–20)

## 2014-10-02 NOTE — Addendum Note (Signed)
Addended by: Leota Jacobsen on: 10/02/2014 01:04 PM   Modules accepted: Orders

## 2014-10-03 NOTE — Telephone Encounter (Signed)
Patient given information to call [facility] to schedule his CT Head order in Epic

## 2014-10-04 ENCOUNTER — Encounter: Payer: Self-pay | Admitting: Internal Medicine

## 2014-10-08 ENCOUNTER — Ambulatory Visit
Admission: RE | Admit: 2014-10-08 | Discharge: 2014-10-08 | Disposition: A | Discharge status: Home / Self Care | Payer: BLUE CROSS/BLUE SHIELD | Source: Ambulatory Visit | Attending: Internal Medicine | Admitting: Internal Medicine

## 2014-10-08 DIAGNOSIS — G44009 Cluster headache syndrome, unspecified, not intractable: Secondary | ICD-10-CM

## 2014-10-08 MED ORDER — IOPAMIDOL (ISOVUE-300) INJECTION 61%
75.0000 mL | Freq: Once | INTRAVENOUS | Status: AC | PRN
  Administered 2014-10-08: 75 mL via INTRAVENOUS

## 2014-10-09 ENCOUNTER — Telehealth: Payer: Self-pay | Admitting: *Deleted

## 2014-10-09 NOTE — Telephone Encounter (Signed)
Left message with CT head results on patient voice mail

## 2014-10-12 ENCOUNTER — Other Ambulatory Visit: Payer: Self-pay | Admitting: Internal Medicine

## 2014-11-06 ENCOUNTER — Other Ambulatory Visit: Payer: Self-pay | Admitting: Internal Medicine

## 2014-11-19 ENCOUNTER — Ambulatory Visit (INDEPENDENT_AMBULATORY_CARE_PROVIDER_SITE_OTHER): Payer: BLUE CROSS/BLUE SHIELD | Admitting: Internal Medicine

## 2014-11-19 ENCOUNTER — Encounter: Payer: Self-pay | Admitting: Internal Medicine

## 2014-11-19 VITALS — BP 120/82 | HR 85 | Temp 97.8°F | Resp 18 | Ht 66.0 in | Wt 146.0 lb

## 2014-11-19 DIAGNOSIS — I10 Essential (primary) hypertension: Secondary | ICD-10-CM

## 2014-11-19 DIAGNOSIS — Z872 Personal history of diseases of the skin and subcutaneous tissue: Secondary | ICD-10-CM | POA: Diagnosis not present

## 2014-11-19 DIAGNOSIS — E039 Hypothyroidism, unspecified: Secondary | ICD-10-CM

## 2014-11-19 DIAGNOSIS — G44019 Episodic cluster headache, not intractable: Secondary | ICD-10-CM

## 2014-11-19 DIAGNOSIS — Z79899 Other long term (current) drug therapy: Secondary | ICD-10-CM | POA: Diagnosis not present

## 2014-11-19 DIAGNOSIS — Z720 Tobacco use: Secondary | ICD-10-CM

## 2014-11-19 DIAGNOSIS — Z23 Encounter for immunization: Secondary | ICD-10-CM | POA: Diagnosis not present

## 2014-11-19 DIAGNOSIS — Z87891 Personal history of nicotine dependence: Secondary | ICD-10-CM

## 2014-11-19 DIAGNOSIS — E785 Hyperlipidemia, unspecified: Secondary | ICD-10-CM | POA: Diagnosis not present

## 2014-11-19 LAB — LIPID PANEL
CHOLESTEROL: 221 mg/dL — AB (ref 125–200)
HDL: 47 mg/dL (ref >=40)
LDL Cholesterol: 159 mg/dL — ABNORMAL HIGH (ref <130)
TRIGLYCERIDES: 76 mg/dL (ref <150)
Total CHOL/HDL Ratio: 4.7 Ratio (ref <=5.0)
VLDL: 15 mg/dL (ref <30)

## 2014-11-19 LAB — HEPATIC FUNCTION PANEL
ALK PHOS: 82 U/L (ref 40–115)
ALT: 18 U/L (ref 9–46)
AST: 18 U/L (ref 10–35)
Albumin: 4.2 g/dL (ref 3.6–5.1)
BILIRUBIN DIRECT: 0.1 mg/dL (ref <=0.2)
BILIRUBIN INDIRECT: 0.6 mg/dL (ref 0.2–1.2)
TOTAL PROTEIN: 6.8 g/dL (ref 6.1–8.1)
Total Bilirubin: 0.7 mg/dL (ref 0.2–1.2)

## 2014-11-19 LAB — TSH: TSH: 1.223 u[IU]/mL (ref 0.350–4.500)

## 2014-11-20 ENCOUNTER — Other Ambulatory Visit: Payer: Self-pay

## 2014-11-20 DIAGNOSIS — E785 Hyperlipidemia, unspecified: Secondary | ICD-10-CM

## 2014-11-20 MED ORDER — ATORVASTATIN CALCIUM 40 MG PO TABS: 40.0000 mg | ORAL_TABLET | Freq: Every day | ORAL | Status: DC

## 2014-12-12 ENCOUNTER — Other Ambulatory Visit: Payer: Self-pay

## 2014-12-12 MED ORDER — BUPROPION HCL ER (XL) 150 MG PO TB24: 150.0000 mg | ORAL_TABLET | Freq: Every day | ORAL | Status: DC

## 2014-12-12 NOTE — Telephone Encounter (Signed)
Pharmacy requested 90-day supply for insurance purposes

## 2015-01-05 ENCOUNTER — Encounter: Payer: Self-pay | Admitting: Internal Medicine

## 2015-01-05 NOTE — Progress Notes (Signed)
   Subjective:    Patient ID: Sean Weaver, male    DOB: Sep 29, 1955, 59 y.o.   MRN: [id number]  HPI 59 year old male with history of essential hypertension, hypothyroidism, hyperlipidemia for six-month follow-up. Treated for cluster headache in September. MRI of the brain was negative. Has occasional headache now and again but not protracted. Continues to smoke. History of psoriasis.    Review of Systems as above     Objective:   Physical Exam  Constitutional: He is oriented to person, place, and time. He appears well-developed and well-nourished. No distress.  HENT:  Head: Normocephalic and atraumatic.  Right Ear: External ear normal.  Left Ear: External ear normal.  Mouth/Throat: Oropharynx is clear and moist.  Eyes: Conjunctivae and EOM are normal. Pupils are equal, round, and reactive to light. Right eye exhibits no discharge. Left eye exhibits no discharge. No scleral icterus.  Funduscopic exam benign  Neck: No JVD present. No thyromegaly present.  Cardiovascular: Normal rate, regular rhythm and normal heart sounds.   No murmur heard. Pulmonary/Chest: Breath sounds normal. No respiratory distress. He has no wheezes. He has no rales.  Lymphadenopathy:    He has no cervical adenopathy.  Neurological: He is alert and oriented to person, place, and time. He has normal reflexes. He displays normal reflexes. No cranial nerve deficit. Coordination normal.  Skin: Skin is warm and dry. No rash noted. He is not diaphoretic.  Psychiatric: He has a normal mood and affect. His behavior is normal. Judgment and thought content normal.          Assessment & Plan:  History of cluster headaches  Essential hypertension-stable   Hyperlipidemia-stable  Hypothyroidism-stable on thyroid placement  History of smoking  Plan: Have given him sample of Relpax to take at onset of headache to see if this will give quick relief. Return in 6 months for physical examination.

## 2015-01-05 NOTE — Patient Instructions (Signed)
Flu vaccine given. Continue same medications and return in 6 months. Try Relpax for cluster headache. It was a pleasure to see you today.

## 2015-04-01 ENCOUNTER — Other Ambulatory Visit: Payer: Self-pay | Admitting: Internal Medicine

## 2015-04-16 ENCOUNTER — Encounter: Payer: Self-pay | Admitting: Internal Medicine

## 2015-05-20 ENCOUNTER — Other Ambulatory Visit: Payer: Self-pay | Admitting: Internal Medicine

## 2015-05-21 ENCOUNTER — Other Ambulatory Visit: Payer: BLUE CROSS/BLUE SHIELD | Admitting: Internal Medicine

## 2015-05-21 DIAGNOSIS — I10 Essential (primary) hypertension: Secondary | ICD-10-CM | POA: Diagnosis not present

## 2015-05-21 DIAGNOSIS — E785 Hyperlipidemia, unspecified: Secondary | ICD-10-CM | POA: Diagnosis not present

## 2015-05-21 DIAGNOSIS — Z125 Encounter for screening for malignant neoplasm of prostate: Secondary | ICD-10-CM | POA: Diagnosis not present

## 2015-05-21 DIAGNOSIS — Z0000 Encounter for general adult medical examination without abnormal findings: Secondary | ICD-10-CM

## 2015-05-21 DIAGNOSIS — E039 Hypothyroidism, unspecified: Secondary | ICD-10-CM | POA: Diagnosis not present

## 2015-05-21 LAB — CBC WITH DIFFERENTIAL/PLATELET
BASOS ABS: 76 {cells}/uL (ref 0–200)
Basophils Relative: 1 %
Eosinophils Absolute: 76 cells/uL (ref 15–500)
Eosinophils Relative: 1 %
HEMATOCRIT: 45.8 % (ref 38.5–50.0)
HEMOGLOBIN: 15.4 g/dL (ref 13.2–17.1)
LYMPHS ABS: 1976 {cells}/uL (ref 850–3900)
Lymphocytes Relative: 26 %
MCH: 28.2 pg (ref 27.0–33.0)
MCHC: 33.6 g/dL (ref 32.0–36.0)
MCV: 83.9 fL (ref 80.0–100.0)
MONO ABS: 760 {cells}/uL (ref 200–950)
MPV: 11.3 fL (ref 7.5–12.5)
Monocytes Relative: 10 %
NEUTROS PCT: 62 %
Neutro Abs: 4712 cells/uL (ref 1500–7800)
Platelets: 256 10*3/uL (ref 140–400)
RBC: 5.46 MIL/uL (ref 4.20–5.80)
RDW: 14.6 % (ref 11.0–15.0)
WBC: 7.6 10*3/uL (ref 3.8–10.8)

## 2015-05-21 LAB — COMPLETE METABOLIC PANEL WITH GFR
ALBUMIN: 4.2 g/dL (ref 3.6–5.1)
ALK PHOS: 88 U/L (ref 40–115)
ALT: 21 U/L (ref 9–46)
AST: 19 U/L (ref 10–35)
BUN: 17 mg/dL (ref 7–25)
CALCIUM: 9.1 mg/dL (ref 8.6–10.3)
CO2: 25 mmol/L (ref 20–31)
Chloride: 103 mmol/L (ref 98–110)
Creat: 1.25 mg/dL (ref 0.70–1.25)
GFR, EST NON AFRICAN AMERICAN: 61 mL/min (ref >=60)
GFR, Est African American: 71 mL/min (ref >=60)
Glucose, Bld: 100 mg/dL — ABNORMAL HIGH (ref 65–99)
POTASSIUM: 4.5 mmol/L (ref 3.5–5.3)
Sodium: 138 mmol/L (ref 135–146)
Total Bilirubin: 0.9 mg/dL (ref 0.2–1.2)
Total Protein: 6.5 g/dL (ref 6.1–8.1)

## 2015-05-21 LAB — LIPID PANEL
CHOL/HDL RATIO: 3.6 ratio (ref <=5.0)
CHOLESTEROL: 166 mg/dL (ref 125–200)
HDL: 46 mg/dL (ref >=40)
LDL Cholesterol: 104 mg/dL (ref <130)
Triglycerides: 81 mg/dL (ref <150)
VLDL: 16 mg/dL (ref <30)

## 2015-05-21 LAB — TSH: TSH: 0.87 m[IU]/L (ref 0.40–4.50)

## 2015-05-22 LAB — PSA: PSA: 1.87 ng/mL (ref <=4.00)

## 2015-05-27 ENCOUNTER — Encounter: Payer: Self-pay | Admitting: Internal Medicine

## 2015-05-27 ENCOUNTER — Ambulatory Visit (INDEPENDENT_AMBULATORY_CARE_PROVIDER_SITE_OTHER): Payer: BLUE CROSS/BLUE SHIELD | Admitting: Internal Medicine

## 2015-05-27 VITALS — BP 130/80 | HR 90 | Temp 98.0°F | Resp 18 | Ht 65.25 in | Wt 148.0 lb

## 2015-05-27 DIAGNOSIS — R7689 Other specified abnormal immunological findings in serum: Secondary | ICD-10-CM

## 2015-05-27 DIAGNOSIS — M722 Plantar fascial fibromatosis: Secondary | ICD-10-CM | POA: Diagnosis not present

## 2015-05-27 DIAGNOSIS — K219 Gastro-esophageal reflux disease without esophagitis: Secondary | ICD-10-CM | POA: Diagnosis not present

## 2015-05-27 DIAGNOSIS — G44009 Cluster headache syndrome, unspecified, not intractable: Secondary | ICD-10-CM | POA: Diagnosis not present

## 2015-05-27 DIAGNOSIS — Z720 Tobacco use: Secondary | ICD-10-CM | POA: Diagnosis not present

## 2015-05-27 DIAGNOSIS — R894 Abnormal immunological findings in specimens from other organs, systems and tissues: Secondary | ICD-10-CM

## 2015-05-27 DIAGNOSIS — I10 Essential (primary) hypertension: Secondary | ICD-10-CM | POA: Diagnosis not present

## 2015-05-27 DIAGNOSIS — Z0000 Encounter for general adult medical examination without abnormal findings: Secondary | ICD-10-CM | POA: Diagnosis not present

## 2015-05-27 DIAGNOSIS — Z860101 Personal history of adenomatous and serrated colon polyps: Secondary | ICD-10-CM

## 2015-05-27 DIAGNOSIS — Z86010 Personal history of colonic polyps: Secondary | ICD-10-CM

## 2015-05-27 DIAGNOSIS — Z87891 Personal history of nicotine dependence: Secondary | ICD-10-CM

## 2015-05-27 DIAGNOSIS — E785 Hyperlipidemia, unspecified: Secondary | ICD-10-CM | POA: Diagnosis not present

## 2015-05-27 DIAGNOSIS — R768 Other specified abnormal immunological findings in serum: Secondary | ICD-10-CM

## 2015-05-27 DIAGNOSIS — N529 Male erectile dysfunction, unspecified: Secondary | ICD-10-CM | POA: Diagnosis not present

## 2015-05-27 DIAGNOSIS — E039 Hypothyroidism, unspecified: Secondary | ICD-10-CM
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
## 2015-05-27 NOTE — Patient Instructions (Signed)
It was pleasure to see today. Please continue same medications and return in 6 months.

## 2015-07-25 ENCOUNTER — Other Ambulatory Visit: Payer: Self-pay | Admitting: Internal Medicine

## 2015-09-02 ENCOUNTER — Other Ambulatory Visit: Payer: Self-pay | Admitting: Internal Medicine

## 2015-09-03 DIAGNOSIS — Z23 Encounter for immunization: Secondary | ICD-10-CM | POA: Diagnosis not present

## 2015-10-28 ENCOUNTER — Ambulatory Visit (INDEPENDENT_AMBULATORY_CARE_PROVIDER_SITE_OTHER): Payer: Self-pay | Admitting: Family Medicine

## 2015-10-28 VITALS — BP 130/70 | HR 124 | Temp 99.9°F | Resp 20 | Ht 65.5 in | Wt 144.8 lb

## 2015-10-28 DIAGNOSIS — J0100 Acute maxillary sinusitis, unspecified: Secondary | ICD-10-CM

## 2015-10-28 MED ORDER — AMOXICILLIN-POT CLAVULANATE 875-125 MG PO TABS: 1.0000 | ORAL_TABLET | Freq: Two times a day (BID) | ORAL | 0 refills | Status: AC

## 2015-10-28 NOTE — Patient Instructions (Addendum)
Sinusitis, Adult Sinusitis is redness, soreness, and inflammation of the paranasal sinuses. Paranasal sinuses are air pockets within the bones of your face. They are located beneath your eyes, in the middle of your forehead, and above your eyes. In healthy paranasal sinuses, mucus is able to drain out, and air is able to circulate through them by way of your nose. However, when your paranasal sinuses are inflamed, mucus and air can become trapped. This can allow bacteria and other germs to grow and cause infection. Sinusitis can develop quickly and last only a short time (acute) or continue over a long period (chronic). Sinusitis that lasts for more than 12 weeks is considered chronic. CAUSES Causes of sinusitis include:  Allergies.  Structural abnormalities, such as displacement of the cartilage that separates your nostrils (deviated septum), which can decrease the air flow through your nose and sinuses and affect sinus drainage.  Functional abnormalities, such as when the small hairs (cilia) that line your sinuses and help remove mucus do not work properly or are not present. SIGNS AND SYMPTOMS Symptoms of acute and chronic sinusitis are the same. The primary symptoms are pain and pressure around the affected sinuses. Other symptoms include:  Upper toothache.  Earache.  Headache.  Bad breath.  Decreased sense of smell and taste.  A cough, which worsens when you are lying flat.  Fatigue.  Fever.  Thick drainage from your nose, which often is green and may contain pus (purulent).  Swelling and warmth over the affected sinuses. DIAGNOSIS Your health care provider will perform a physical exam. During your exam, your health care provider may perform any of the following to help determine if you have acute sinusitis or chronic sinusitis:  Look in your nose for signs of abnormal growths in your nostrils (nasal polyps).  Tap over the affected sinus to check for signs of  infection.  View the inside of your sinuses using an imaging device that has a light attached (endoscope). If your health care provider suspects that you have chronic sinusitis, one or more of the following tests may be recommended:  Allergy tests.  Nasal culture. A sample of mucus is taken from your nose, sent to a lab, and screened for bacteria.  Nasal cytology. A sample of mucus is taken from your nose and examined by your health care provider to determine if your sinusitis is related to an allergy. TREATMENT Most cases of acute sinusitis are related to a viral infection and will resolve on their own within 10 days. Sometimes, medicines are prescribed to help relieve symptoms of both acute and chronic sinusitis. These may include pain medicines, decongestants, nasal steroid sprays, or saline sprays. However, for sinusitis related to a bacterial infection, your health care provider will prescribe antibiotic medicines. These are medicines that will help kill the bacteria causing the infection. Rarely, sinusitis is caused by a fungal infection. In these cases, your health care provider will prescribe antifungal medicine. For some cases of chronic sinusitis, surgery is needed. Generally, these are cases in which sinusitis recurs more than 3 times per year, despite other treatments. HOME CARE INSTRUCTIONS  Drink plenty of water. Water helps thin the mucus so your sinuses can drain more easily.  Use a humidifier.  Inhale steam 3-4 times a day (for example, sit in the bathroom with the shower running).  Apply a warm, moist washcloth to your face 3-4 times a day, or as directed by your health care provider.  Use saline nasal sprays to help   moisten and clean your sinuses.  Take medicines only as directed by your health care provider.  If you were prescribed either an antibiotic or antifungal medicine, finish it all even if you start to feel better. SEEK IMMEDIATE MEDICAL CARE IF:  You have  increasing pain or severe headaches.  You have nausea, vomiting, or drowsiness.  You have swelling around your face.  You have vision problems.  You have a stiff neck.  You have difficulty breathing.   This information is not intended to replace advice given to you by your health care provider. Make sure you discuss any questions you have with your health care provider.   Document Released: 12/22/2004 Document Revised: 01/12/2014 Document Reviewed: 01/06/2011 Elsevier Interactive Patient Education 2016 [employer].   Mucinex as directed/Ibuprofen/Tylenol

## 2015-10-28 NOTE — Progress Notes (Signed)
Subjective:     Sean Weaver is a 60 y.o. male who presents for evaluation of sinus pain. Symptoms include: congestion, cough, facial pain, fevers, headaches, nasal congestion, sinus pressure and fatigue. Onset of symptoms was congestion, cough, facial pain, fevers, headaches, nasal congestion, sinus pressure and fatigue days ago. Symptoms have been gradually worsening since that time. Past history is significant for no history of pneumonia or bronchitis. Patient is a smoker  (1 ppd x 6 yrs).Pt has had Flu shot. Low grade temp, has taken Ibuprofen, not taken this am. Pt symptoms greater than 10 days.   The following portions of the patient's history were reviewed and updated as appropriate: allergies, current medications and past medical history.  Review of Systems Constitutional: negative Eyes: negative Ears, nose, mouth, throat, and face: positive for nasal congestion and PND Respiratory: negative Cardiovascular: negative Behavioral/Psych: negative Allergic/Immunologic: positive for hx seasonal allergies   Objective:    General appearance: alert, cooperative and no distress Head: Normocephalic, without obvious abnormality, atraumatic Eyes: conjunctivae/corneas clear. PERRL, EOM's intact. Fundi benign. Ears: normal TM's and external ear canals both ears Nose: Nares normal. Septum midline. Mucosa normal. No drainage or sinus tenderness., yellow discharge, moderate congestion, turbinates swollen, right turbinate swollen, left turbinate pink, sinus tenderness bilateral Throat: lips, mucosa, and tongue normal; teeth and gums normal Neck: marked anterior cervical adenopathy and supple, symmetrical, trachea midline Lungs: clear to auscultation bilaterally Chest wall: no tenderness Skin: Skin color, texture, turgor normal. No rashes or lesions Lymph nodes: Cervical adenopathy: bil    Assessment:    Acute bacterial sinusitis.    Plan:    Augmentin per medication orders. Follow up in  7 days or as needed.   See pt instructions/Mucinex/Ibuprofen/Tylenol Pt does not like to [place] nasal sprays

## 2015-11-04 ENCOUNTER — Other Ambulatory Visit: Payer: Self-pay | Admitting: Internal Medicine

## 2015-11-04 DIAGNOSIS — E785 Hyperlipidemia, unspecified: Secondary | ICD-10-CM

## 2015-11-18 ENCOUNTER — Other Ambulatory Visit: Payer: BLUE CROSS/BLUE SHIELD | Admitting: Internal Medicine

## 2015-11-18 DIAGNOSIS — E039 Hypothyroidism, unspecified: Secondary | ICD-10-CM

## 2015-11-18 DIAGNOSIS — I10 Essential (primary) hypertension: Secondary | ICD-10-CM | POA: Diagnosis not present

## 2015-11-18 LAB — LIPID PANEL
CHOL/HDL RATIO: 3 ratio (ref <5.0)
Cholesterol: 151 mg/dL (ref <200)
HDL: 51 mg/dL (ref >40)
LDL CALC: 91 mg/dL (ref <100)
Triglycerides: 45 mg/dL (ref <150)
VLDL: 9 mg/dL (ref <30)

## 2015-11-18 LAB — HEPATIC FUNCTION PANEL
ALT: 14 U/L (ref 9–46)
AST: 15 U/L (ref 10–35)
Albumin: 4.1 g/dL (ref 3.6–5.1)
Alkaline Phosphatase: 98 U/L (ref 40–115)
BILIRUBIN DIRECT: 0.1 mg/dL (ref <=0.2)
BILIRUBIN TOTAL: 0.6 mg/dL (ref 0.2–1.2)
Indirect Bilirubin: 0.5 mg/dL (ref 0.2–1.2)
Total Protein: 6.7 g/dL (ref 6.1–8.1)

## 2015-11-18 LAB — TSH: TSH: 0.6 m[IU]/L (ref 0.40–4.50)

## 2015-11-19 ENCOUNTER — Other Ambulatory Visit: Payer: BLUE CROSS/BLUE SHIELD | Admitting: Internal Medicine

## 2015-11-25 ENCOUNTER — Ambulatory Visit (INDEPENDENT_AMBULATORY_CARE_PROVIDER_SITE_OTHER): Payer: BLUE CROSS/BLUE SHIELD | Admitting: Internal Medicine

## 2015-11-25 ENCOUNTER — Encounter: Payer: Self-pay | Admitting: Internal Medicine

## 2015-11-25 VITALS — BP 120/80 | HR 87 | Temp 98.0°F | Ht 66.0 in | Wt 145.0 lb

## 2015-11-25 DIAGNOSIS — I10 Essential (primary) hypertension: Secondary | ICD-10-CM | POA: Diagnosis not present

## 2015-11-25 DIAGNOSIS — E7800 Pure hypercholesterolemia, unspecified: Secondary | ICD-10-CM | POA: Diagnosis not present

## 2015-11-25 DIAGNOSIS — N529 Male erectile dysfunction, unspecified: Secondary | ICD-10-CM

## 2015-11-25 DIAGNOSIS — K219 Gastro-esophageal reflux disease without esophagitis: Secondary | ICD-10-CM | POA: Diagnosis not present

## 2015-11-25 DIAGNOSIS — E039 Hypothyroidism, unspecified: Secondary | ICD-10-CM

## 2015-11-25 MED ORDER — RAMIPRIL 2.5 MG PO CAPS: 2.5000 mg | ORAL_CAPSULE | Freq: Every day | ORAL | 3 refills | Status: DC

## 2015-11-25 NOTE — Progress Notes (Signed)
   Subjective:    Patient ID: EDLEY OH, male    DOB: 08-Jun-1955, 60 y.o.   MRN: [id number]  HPI   60 year old  Male for 6 month follow up. History of essential hypertension, hyperlipidemia, hypothyroidism. Lipid panel, liver functions and TSH all within normal limits. He had flu vaccine done elsewhere. Declines HIV and hep C testing. Declines Zostavax vaccine. Previously has had shingles. History of erectile dysfunction. Has prescription for medication. Had a sinus infection after returning from vacation at beach treated at [facility]. History of psoriasis.    Review of Systems as above     Objective:   Physical Exam  Skin warm and dry. Nodes none. Neck is supple without JVD thyromegaly or carotid bruits. TMs and pharynx are clear. Chest clear to auscultation. Cardiac exam regular rate and rhythm normal S1 and S2. Extremities without edema.      Assessment & Plan:  Essential hypertension-stable on current regimen  History of smoking-not ready to quit  Hyperlipidemia-stable lipid panel on lipid-lowering medication  Hypothyroidism-TSH stable on thyroid replacement  Erectile dysfunction  Plan: Return in 6 months for physical exam and continue same medications.

## 2015-11-25 NOTE — Patient Instructions (Signed)
It was a pleasure to see you today. Please continue same medications and return in 6 months.

## 2015-11-28 ENCOUNTER — Other Ambulatory Visit: Payer: Self-pay | Admitting: Internal Medicine

## 2015-12-12 ENCOUNTER — Encounter: Payer: Self-pay | Admitting: Nurse Practitioner

## 2015-12-12 ENCOUNTER — Ambulatory Visit (INDEPENDENT_AMBULATORY_CARE_PROVIDER_SITE_OTHER): Payer: Self-pay | Admitting: Nurse Practitioner

## 2015-12-12 VITALS — BP 142/62 | HR 114 | Temp 98.2°F | Resp 20 | Ht 65.5 in | Wt 142.4 lb

## 2015-12-12 DIAGNOSIS — H6592 Unspecified nonsuppurative otitis media, left ear: Secondary | ICD-10-CM

## 2015-12-12 DIAGNOSIS — J069 Acute upper respiratory infection, unspecified: Secondary | ICD-10-CM

## 2015-12-12 MED ORDER — FLUTICASONE PROPIONATE 50 MCG/ACT NA SUSP: 2.0000 | Freq: Every day | NASAL | 6 refills | Status: DC

## 2015-12-12 MED ORDER — AMOXICILLIN 500 MG PO CAPS: 500.0000 mg | ORAL_CAPSULE | Freq: Two times a day (BID) | ORAL | 0 refills | Status: DC

## 2015-12-12 NOTE — Progress Notes (Signed)
   Subjective:    Patient ID: Sean Weaver, male    DOB: April 18, 1955, 60 y.o.   MRN: [id number]  The patient is a 60 y.o. Male that presents with sore throat pain and left ear pain with swallowing x 4-5 days ago.  The patient states the symptoms worsen at night.  The patient also c/o dry cough.  Denies fever, chills, loss appetite, change in activity,  runny nose or congestion.  The patient takes medications for HTN which he thinks may be contributing to the cough, but patient also admits to smoking (1ppd x 6 years).  The patient has a history of allergies for which he takes Zyrtec.  The patient has been taking Advil as needed for discomfort with little relief.  Patient states he is going out of town and wants to be treated before he leaves.  Patient also states he has not taken any of his regular medications today with the exception of his Synthroid.      Review of Systems  Constitutional: Negative for activity change, appetite change, chills, fatigue and fever.  HENT: Positive for ear pain (left sided. Rates pain 3/10, describes as "aching" with swallowing only), postnasal drip and sore throat. Negative for congestion, rhinorrhea and sinus pain.   Eyes: Negative.   Respiratory: Negative.   Cardiovascular: Negative.   Gastrointestinal: Negative for nausea and vomiting.  Musculoskeletal: Negative.   Skin: Negative.        Objective:   Physical Exam  Constitutional: He is oriented to person, place, and time. He appears well-developed and well-nourished. No distress.  HENT:  Head: Normocephalic.  Eyes: Conjunctivae and EOM are normal. Pupils are equal, round, and reactive to light.  Left TM erythematous, bulging. No discharge or drainage.  Neck: Normal range of motion. Neck supple. No tracheal deviation present. No thyromegaly present.  Cardiovascular: Normal rate, regular rhythm and normal heart sounds.   Pulmonary/Chest: Effort normal and breath sounds normal. No respiratory  distress.  Abdominal: Soft. Bowel sounds are normal.  Musculoskeletal: Normal range of motion.  Lymphadenopathy:    He has no cervical adenopathy.  Neurological: He is alert and oriented to person, place, and time. No cranial nerve deficit.  Skin: Skin is warm and dry. No rash noted.  Psychiatric: He has a normal mood and affect. His behavior is normal. Judgment and thought content normal.          Assessment & Plan:  Patient provided patient education for Acute URI and Left Otitis Media.  Verbal instructions to patient include honey/lemon for throat comfort, warm liquids and increase fluids. Patient to f/u as needed or if no improvement. Patient verbalized understanding.

## 2015-12-12 NOTE — Patient Instructions (Addendum)
Otitis Media, Adult Otitis media is redness, soreness, and inflammation of the middle ear. Otitis media may be caused by allergies or, most commonly, by infection. Often it occurs as a complication of the common cold. What are the signs or symptoms? Symptoms of otitis media may include:  Earache.  Fever.  Ringing in your ear.  Headache.  Leakage of fluid from the ear. How is this diagnosed? To diagnose otitis media, your health care provider will examine your ear with an otoscope. This is an instrument that allows your health care provider to see into your ear in order to examine your eardrum. Your health care provider also will ask you questions about your symptoms. How is this treated? Typically, otitis media resolves on its own within 3-5 days. Your health care provider may prescribe medicine to ease your symptoms of pain. If otitis media does not resolve within 5 days or is recurrent, your health care provider may prescribe antibiotic medicines if he or she suspects that a bacterial infection is the cause. Follow these instructions at home:  If you were prescribed an antibiotic medicine, finish it all even if you start to feel better.  Take medicines only as directed by your health care provider.  Keep all follow-up visits as directed by your health care provider. Contact a health care provider if:  You have otitis media only in one ear, or bleeding from your nose, or both.  You notice a lump on your neck.  You are not getting better in 3-5 days.  You feel worse instead of better. Get help right away if:  You have pain that is not controlled with medicine.  You have swelling, redness, or pain around your ear or stiffness in your neck.  You notice that part of your face is paralyzed.  You notice that the bone behind your ear (mastoid) is tender when you touch it. This information is not intended to replace advice given to you by your health care provider. Make sure you  discuss any questions you have with your health care provider. Document Released: 09/27/2003 Document Revised: 05/30/2015 Document Reviewed: 07/19/2012 Elsevier Interactive Patient Education  2017 [place].  Upper Respiratory Infection, Adult Most upper respiratory infections (URIs) are a viral infection of the air passages leading to the lungs. A URI affects the nose, throat, and upper air passages. The most common type of URI is nasopharyngitis and is typically referred to as "the common cold." URIs run their course and usually go away on their own. Most of the time, a URI does not require medical attention, but sometimes a bacterial infection in the upper airways can follow a viral infection. This is called a secondary infection. Sinus and middle ear infections are common types of secondary upper respiratory infections. Bacterial pneumonia can also complicate a URI. A URI can worsen asthma and chronic obstructive pulmonary disease (COPD). Sometimes, these complications can require emergency medical care and may be life threatening. What are the causes? Almost all URIs are caused by viruses. A virus is a type of germ and can spread from one person to another. What increases the risk? You may be at risk for a URI if:  You smoke.  You have chronic heart or lung disease.  You have a weakened defense (immune) system.  You are very young or very old.  You have nasal allergies or asthma.  You work in crowded or poorly ventilated areas.  You work in health care facilities or schools. What are  the signs or symptoms? Symptoms typically develop 2-3 days after you come in contact with a cold virus. Most viral URIs last 7-10 days. However, viral URIs from the influenza virus (flu virus) can last 14-18 days and are typically more severe. Symptoms may include:  Runny or stuffy (congested) nose.  Sneezing.  Cough.  Sore throat.  Headache.  Fatigue.  Fever.  Loss of  appetite.  Pain in your forehead, behind your eyes, and over your cheekbones (sinus pain).  Muscle aches. How is this diagnosed? Your health care provider may diagnose a URI by:  Physical exam.  Tests to check that your symptoms are not due to another condition such as:  Strep throat.  Sinusitis.  Pneumonia.  Asthma. How is this treated? A URI goes away on its own with time. It cannot be cured with medicines, but medicines may be prescribed or recommended to relieve symptoms. Medicines may help:  Reduce your fever.  Reduce your cough.  Relieve nasal congestion. Follow these instructions at home:  Take medicines only as directed by your health care provider.  Gargle warm saltwater or take cough drops to comfort your throat as directed by your health care provider.  Use a warm mist humidifier or inhale steam from a shower to increase air moisture. This may make it easier to breathe.  Drink enough fluid to keep your urine clear or pale yellow.  Eat soups and other clear broths and maintain good nutrition.  Rest as needed.  Return to work when your temperature has returned to normal or as your health care provider advises. You may need to stay home longer to avoid infecting others. You can also use a face mask and careful hand washing to prevent spread of the virus.  Increase the usage of your inhaler if you have asthma.  Do not use any tobacco products, including cigarettes, chewing tobacco, or electronic cigarettes. If you need help quitting, ask your health care provider. How is this prevented? The best way to protect yourself from getting a cold is to practice good hygiene.  Avoid oral or hand contact with people with cold symptoms.  Wash your hands often if contact occurs. There is no clear evidence that vitamin C, vitamin E, echinacea, or exercise reduces the chance of developing a cold. However, it is always recommended to get plenty of rest, exercise, and  practice good nutrition. Contact a health care provider if:  You are getting worse rather than better.  Your symptoms are not controlled by medicine.  You have chills.  You have worsening shortness of breath.  You have brown or red mucus.  You have yellow or brown nasal discharge.  You have pain in your face, especially when you bend forward.  You have a fever.  You have swollen neck glands.  You have pain while swallowing.  You have white areas in the back of your throat. Get help right away if:  You have severe or persistent:  Headache.  Ear pain.  Sinus pain.  Chest pain.  You have chronic lung disease and any of the following:  Wheezing.  Prolonged cough.  Coughing up blood.  A change in your usual mucus.  You have a stiff neck.  You have changes in your:  Vision.  Hearing.  Thinking.  Mood. This information is not intended to replace advice given to you by your health care provider. Make sure you discuss any questions you have with your health care provider. Document Released: 06/17/2000 Document  Revised: 08/25/2015 Document Reviewed: 03/29/2013 Elsevier Interactive Patient Education  2017 [place].  Upper Respiratory Infection, Adult Most upper respiratory infections (URIs) are a viral infection of the air passages leading to the lungs. A URI affects the nose, throat, and upper air passages. The most common type of URI is nasopharyngitis and is typically referred to as "the common cold." URIs run their course and usually go away on their own. Most of the time, a URI does not require medical attention, but sometimes a bacterial infection in the upper airways can follow a viral infection. This is called a secondary infection. Sinus and middle ear infections are common types of secondary upper respiratory infections. Bacterial pneumonia can also complicate a URI. A URI can worsen asthma and chronic obstructive pulmonary disease (COPD).  Sometimes, these complications can require emergency medical care and may be life threatening. What are the causes? Almost all URIs are caused by viruses. A virus is a type of germ and can spread from one person to another. What increases the risk? You may be at risk for a URI if:  You smoke.  You have chronic heart or lung disease.  You have a weakened defense (immune) system.  You are very young or very old.  You have nasal allergies or asthma.  You work in crowded or poorly ventilated areas.  You work in health care facilities or schools. What are the signs or symptoms? Symptoms typically develop 2-3 days after you come in contact with a cold virus. Most viral URIs last 7-10 days. However, viral URIs from the influenza virus (flu virus) can last 14-18 days and are typically more severe. Symptoms may include:  Runny or stuffy (congested) nose.  Sneezing.  Cough.  Sore throat.  Headache.  Fatigue.  Fever.  Loss of appetite.  Pain in your forehead, behind your eyes, and over your cheekbones (sinus pain).  Muscle aches. How is this diagnosed? Your health care provider may diagnose a URI by:  Physical exam.  Tests to check that your symptoms are not due to another condition such as:  Strep throat.  Sinusitis.  Pneumonia.  Asthma. How is this treated? A URI goes away on its own with time. It cannot be cured with medicines, but medicines may be prescribed or recommended to relieve symptoms. Medicines may help:  Reduce your fever.  Reduce your cough.  Relieve nasal congestion. Follow these instructions at home:  Take medicines only as directed by your health care provider.  Gargle warm saltwater or take cough drops to comfort your throat as directed by your health care provider.  Use a warm mist humidifier or inhale steam from a shower to increase air moisture. This may make it easier to breathe.  Drink enough fluid to keep your urine clear or pale  yellow.  Eat soups and other clear broths and maintain good nutrition.  Rest as needed.  Return to work when your temperature has returned to normal or as your health care provider advises. You may need to stay home longer to avoid infecting others. You can also use a face mask and careful hand washing to prevent spread of the virus.  Increase the usage of your inhaler if you have asthma.  Do not use any tobacco products, including cigarettes, chewing tobacco, or electronic cigarettes. If you need help quitting, ask your health care provider. How is this prevented? The best way to protect yourself from getting a cold is to practice good hygiene.  Avoid oral  or hand contact with people with cold symptoms.  Wash your hands often if contact occurs. There is no clear evidence that vitamin C, vitamin E, echinacea, or exercise reduces the chance of developing a cold. However, it is always recommended to get plenty of rest, exercise, and practice good nutrition. Contact a health care provider if:  You are getting worse rather than better.  Your symptoms are not controlled by medicine.  You have chills.  You have worsening shortness of breath.  You have brown or red mucus.  You have yellow or brown nasal discharge.  You have pain in your face, especially when you bend forward.  You have a fever.  You have swollen neck glands.  You have pain while swallowing.  You have white areas in the back of your throat. Get help right away if:  You have severe or persistent:  Headache.  Ear pain.  Sinus pain.  Chest pain.  You have chronic lung disease and any of the following:  Wheezing.  Prolonged cough.  Coughing up blood.  A change in your usual mucus.  You have a stiff neck.  You have changes in your:  Vision.  Hearing.  Thinking.  Mood. This information is not intended to replace advice given to you by your health care provider. Make sure you discuss any  questions you have with your health care provider. Document Released: 06/17/2000 Document Revised: 08/25/2015 Document Reviewed: 03/29/2013 Elsevier Interactive Patient Education  2017 [employer].

## 2015-12-25 ENCOUNTER — Other Ambulatory Visit: Payer: Self-pay | Admitting: Internal Medicine

## 2016-01-21 DIAGNOSIS — F4323 Adjustment disorder with mixed anxiety and depressed mood: Secondary | ICD-10-CM | POA: Diagnosis not present

## 2016-02-20 DIAGNOSIS — F4323 Adjustment disorder with mixed anxiety and depressed mood: Secondary | ICD-10-CM | POA: Diagnosis not present

## 2016-04-20 ENCOUNTER — Other Ambulatory Visit: Payer: Self-pay | Admitting: Internal Medicine

## 2016-06-02 ENCOUNTER — Other Ambulatory Visit: Payer: BLUE CROSS/BLUE SHIELD | Admitting: Internal Medicine

## 2016-06-02 DIAGNOSIS — E039 Hypothyroidism, unspecified: Secondary | ICD-10-CM | POA: Diagnosis not present

## 2016-06-02 DIAGNOSIS — Z0000 Encounter for general adult medical examination without abnormal findings: Secondary | ICD-10-CM

## 2016-06-02 DIAGNOSIS — I10 Essential (primary) hypertension: Secondary | ICD-10-CM | POA: Diagnosis not present

## 2016-06-02 DIAGNOSIS — Z8042 Family history of malignant neoplasm of prostate: Secondary | ICD-10-CM

## 2016-06-02 DIAGNOSIS — Z125 Encounter for screening for malignant neoplasm of prostate: Secondary | ICD-10-CM

## 2016-06-02 DIAGNOSIS — E785 Hyperlipidemia, unspecified: Secondary | ICD-10-CM

## 2016-06-02 LAB — COMPLETE METABOLIC PANEL WITH GFR
ALT: 15 U/L (ref 9–46)
AST: 14 U/L (ref 10–35)
Albumin: 3.9 g/dL (ref 3.6–5.1)
Alkaline Phosphatase: 89 U/L (ref 40–115)
BUN: 11 mg/dL (ref 7–25)
CHLORIDE: 106 mmol/L (ref 98–110)
CO2: 25 mmol/L (ref 20–31)
Calcium: 9 mg/dL (ref 8.6–10.3)
Creat: 1.13 mg/dL (ref 0.70–1.25)
GFR, EST NON AFRICAN AMERICAN: 69 mL/min (ref >=60)
GFR, Est African American: 80 mL/min (ref >=60)
GLUCOSE: 96 mg/dL (ref 65–99)
POTASSIUM: 4.5 mmol/L (ref 3.5–5.3)
SODIUM: 140 mmol/L (ref 135–146)
TOTAL PROTEIN: 6.1 g/dL (ref 6.1–8.1)
Total Bilirubin: 0.7 mg/dL (ref 0.2–1.2)

## 2016-06-02 LAB — LIPID PANEL
CHOL/HDL RATIO: 3.3 ratio (ref <5.0)
Cholesterol: 154 mg/dL (ref <200)
HDL: 47 mg/dL (ref >40)
LDL CALC: 93 mg/dL (ref <100)
Triglycerides: 68 mg/dL (ref <150)
VLDL: 14 mg/dL (ref <30)

## 2016-06-02 LAB — CBC
HCT: 43.5 % (ref 38.5–50.0)
Hemoglobin: 14.2 g/dL (ref 13.2–17.1)
MCH: 27.5 pg (ref 27.0–33.0)
MCHC: 32.6 g/dL (ref 32.0–36.0)
MCV: 84.3 fL (ref 80.0–100.0)
MPV: 11.4 fL (ref 7.5–12.5)
PLATELETS: 230 10*3/uL (ref 140–400)
RBC: 5.16 MIL/uL (ref 4.20–5.80)
RDW: 14.8 % (ref 11.0–15.0)
WBC: 7.2 10*3/uL (ref 3.8–10.8)

## 2016-06-02 LAB — TSH: TSH: 0.41 mIU/L (ref 0.40–4.50)

## 2016-06-03 LAB — PSA: PSA: 0.8 ng/mL (ref <=4.0)

## 2016-06-08 ENCOUNTER — Ambulatory Visit (INDEPENDENT_AMBULATORY_CARE_PROVIDER_SITE_OTHER): Payer: BLUE CROSS/BLUE SHIELD | Admitting: Internal Medicine

## 2016-06-08 ENCOUNTER — Encounter: Payer: Self-pay | Admitting: Internal Medicine

## 2016-06-08 VITALS — BP 130/86 | HR 94 | Temp 98.3°F | Ht 65.25 in | Wt 138.0 lb

## 2016-06-08 DIAGNOSIS — Z0000 Encounter for general adult medical examination without abnormal findings: Secondary | ICD-10-CM | POA: Diagnosis not present

## 2016-06-08 DIAGNOSIS — E039 Hypothyroidism, unspecified: Secondary | ICD-10-CM

## 2016-06-08 DIAGNOSIS — N529 Male erectile dysfunction, unspecified: Secondary | ICD-10-CM | POA: Diagnosis not present

## 2016-06-08 DIAGNOSIS — I10 Essential (primary) hypertension: Secondary | ICD-10-CM | POA: Diagnosis not present

## 2016-06-08 DIAGNOSIS — Z87891 Personal history of nicotine dependence: Secondary | ICD-10-CM | POA: Diagnosis not present

## 2016-06-08 DIAGNOSIS — Z86010 Personal history of colonic polyps: Secondary | ICD-10-CM | POA: Diagnosis not present

## 2016-06-08 DIAGNOSIS — E7800 Pure hypercholesterolemia, unspecified: Secondary | ICD-10-CM | POA: Diagnosis not present

## 2016-06-08 LAB — POCT URINALYSIS DIPSTICK
BILIRUBIN UA: NEGATIVE
GLUCOSE UA: NEGATIVE
Ketones, UA: NEGATIVE
Leukocytes, UA: NEGATIVE
Nitrite, UA: NEGATIVE
Protein, UA: NEGATIVE
RBC UA: NEGATIVE
SPEC GRAV UA: 1.01 (ref 1.010–1.025)
Urobilinogen, UA: 0.2 E.U./dL
pH, UA: 6.5 (ref 5.0–8.0)

## 2016-06-08 MED ORDER — SILDENAFIL CITRATE 20 MG PO TABS: ORAL_TABLET | ORAL | 11 refills | Status: DC

## 2016-06-08 NOTE — Patient Instructions (Signed)
It was pleasure to see you today. Continue same medications and return in one year or as needed. Time for repeat colonoscopy. Prescription mailed for Shingrix vaccine.
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
## 2016-06-13 ENCOUNTER — Other Ambulatory Visit: Payer: Self-pay | Admitting: Internal Medicine

## 2016-07-04 ENCOUNTER — Other Ambulatory Visit: Payer: Self-pay | Admitting: Nurse Practitioner

## 2016-07-21 ENCOUNTER — Ambulatory Visit (INDEPENDENT_AMBULATORY_CARE_PROVIDER_SITE_OTHER): Payer: Self-pay | Admitting: Nurse Practitioner

## 2016-07-21 ENCOUNTER — Encounter: Payer: Self-pay | Admitting: Nurse Practitioner

## 2016-07-21 VITALS — BP 110/64 | HR 97 | Temp 98.9°F | Wt 144.4 lb

## 2016-07-21 DIAGNOSIS — J3089 Other allergic rhinitis: Secondary | ICD-10-CM

## 2016-07-21 MED ORDER — FLUTICASONE PROPIONATE 50 MCG/ACT NA SUSP: 2.0000 | Freq: Every day | NASAL | 2 refills | Status: DC

## 2016-07-21 MED ORDER — MONTELUKAST SODIUM 10 MG PO TABS: 10.0000 mg | ORAL_TABLET | Freq: Every day | ORAL | 2 refills | Status: DC

## 2016-07-21 NOTE — Progress Notes (Signed)
Subjective:  Sean Weaver is a 61 y.o. male who presents for evaluation of possible sinusitis.  Symptoms include sinus pressure and sinus pain.  Onset of symptoms was 1 day ago, and has been gradually worsening since that time.  Denies fever, cough, congestion, runny nose, sore throat, or sneezing.  Treatment to date:  ibuprofen.  High risk factors for influenza complications:  history of seasonal allergies. Patient states he was completing some renovations on his home and did encounter a lot of dust.  The following portions of the patient's history were reviewed and updated as appropriate:  allergies, current medications and past medical history.  Constitutional: negative Eyes: positive for teary eyes Ears, nose, mouth, throat, and face: positive for sinus pain and pressure, negative for earaches, nasal congestion and sore throat Respiratory: negative Cardiovascular: negative Allergic/Immunologic: positive for hay fever Objective:  BP 110/64   Pulse 97   Temp 98.9 F (37.2 C)   Wt 144 lb 6.4 oz (65.5 kg)   SpO2 94%   BMI 23.85 kg/m  General appearance: alert, cooperative and no distress Head: Normocephalic, without obvious abnormality, atraumatic Eyes: conjunctivae/corneas clear. PERRL, EOM's intact. Fundi benign. Ears: normal TM's and external ear canals both ears Nose: Nares normal. Septum midline. Mucosa normal. No drainage or sinus tenderness., sinus tenderness left, maxillary tenderness, moderate Throat: lips, mucosa, and tongue normal; teeth and gums normal Lungs: clear to auscultation bilaterally Heart: regular rate and rhythm, S1, S2 normal, no murmur, click, rub or gallop Neurologic: Grossly normal    Assessment:  Allergic Rhinitis    Plan:  Discussed diagnosis and treatment of sinusitis. Discussed the importance of avoiding unnecessary antibiotic therapy. Singulair per orders. Nasal steroids per orders. Patient will follow up if symptoms worsen, fever, cough, or  any other concerns.  Patient verbalized understanding.  Follow up as needed.

## 2016-07-21 NOTE — Patient Instructions (Addendum)
Allergic Rhinitis Allergic rhinitis is when the mucous membranes in the nose respond to allergens. Allergens are particles in the air that cause your body to have an allergic reaction. This causes you to release allergic antibodies. Through a chain of events, these eventually cause you to release histamine into the blood stream. Although meant to protect the body, it is this release of histamine that causes your discomfort, such as frequent sneezing, congestion, and an itchy, runny nose. What are the causes? Seasonal allergic rhinitis (hay fever) is caused by pollen allergens that may come from grasses, trees, and weeds. Year-round allergic rhinitis (perennial allergic rhinitis) is caused by allergens such as house dust mites, pet dander, and mold spores. What are the signs or symptoms?  Nasal stuffiness (congestion).  Itchy, runny nose with sneezing and tearing of the eyes. How is this diagnosed? Your health care provider can help you determine the allergen or allergens that trigger your symptoms. If you and your health care provider are unable to determine the allergen, skin or blood testing may be used. Your health care provider will diagnose your condition after taking your health history and performing a physical exam. Your health care provider may assess you for other related conditions, such as asthma, pink eye, or an ear infection. How is this treated? Allergic rhinitis does not have a cure, but it can be controlled by:  Medicines that block allergy symptoms. These may include allergy shots, nasal sprays, and oral antihistamines.  Avoiding the allergen. Hay fever may often be treated with antihistamines in pill or nasal spray forms. Antihistamines block the effects of histamine. There are over-the-counter medicines that may help with nasal congestion and swelling around the eyes. Check with your health care provider before taking or giving this medicine. If avoiding the allergen or the  medicine prescribed do not work, there are many new medicines your health care provider can prescribe. Stronger medicine may be used if initial measures are ineffective. Desensitizing injections can be used if medicine and avoidance does not work. Desensitization is when a patient is given ongoing shots until the body becomes less sensitive to the allergen. Make sure you follow up with your health care provider if problems continue. Follow these instructions at home: It is not possible to completely avoid allergens, but you can reduce your symptoms by taking steps to limit your exposure to them. It helps to know exactly what you are allergic to so that you can avoid your specific triggers. Contact a health care provider if:  You have a fever.  You develop a cough that does not stop easily (persistent).  You have shortness of breath.  You start wheezing.  Symptoms interfere with normal daily activities. This information is not intended to replace advice given to you by your health care provider. Make sure you discuss any questions you have with your health care provider. Document Released: 09/16/2000 Document Revised: 08/23/2015 Document Reviewed: 08/29/2012 Elsevier Interactive Patient Education  2017 Elsevier Inc.  

## 2016-07-24 ENCOUNTER — Telehealth: Payer: Self-pay | Admitting: Nurse Practitioner

## 2016-07-24 NOTE — Telephone Encounter (Signed)
Pt called back.  I asked patient if he is feeling better and he stated he was.  Told him to call back if he needed anything.  Nothing further needed at this time.

## 2016-07-24 NOTE — Telephone Encounter (Signed)
Called patient to check his status.  Reached voicemail, left message asking patient to return my call.

## 2016-08-16 ENCOUNTER — Other Ambulatory Visit: Payer: Self-pay | Admitting: Nurse Practitioner

## 2016-08-24 DIAGNOSIS — H52221 Regular astigmatism, right eye: Secondary | ICD-10-CM | POA: Diagnosis not present

## 2016-08-24 DIAGNOSIS — H5211 Myopia, right eye: Secondary | ICD-10-CM | POA: Diagnosis not present

## 2016-08-24 DIAGNOSIS — H52222 Regular astigmatism, left eye: Secondary | ICD-10-CM | POA: Diagnosis not present

## 2016-08-24 DIAGNOSIS — H524 Presbyopia: Secondary | ICD-10-CM | POA: Diagnosis not present

## 2016-09-17 ENCOUNTER — Encounter: Payer: Self-pay | Admitting: Internal Medicine

## 2016-09-17 ENCOUNTER — Ambulatory Visit (INDEPENDENT_AMBULATORY_CARE_PROVIDER_SITE_OTHER): Payer: BLUE CROSS/BLUE SHIELD | Admitting: Internal Medicine

## 2016-09-17 VITALS — BP 118/76 | HR 79 | Temp 97.8°F | Wt 146.0 lb

## 2016-09-17 DIAGNOSIS — S61411A Laceration without foreign body of right hand, initial encounter: Secondary | ICD-10-CM | POA: Diagnosis not present

## 2016-09-17 DIAGNOSIS — S61401A Unspecified open wound of right hand, initial encounter: Secondary | ICD-10-CM

## 2016-09-17 MED ORDER — DOXYCYCLINE HYCLATE 100 MG PO TABS: 100.0000 mg | ORAL_TABLET | Freq: Two times a day (BID) | ORAL | 0 refills | Status: DC

## 2016-09-17 MED ORDER — CEFTRIAXONE SODIUM 1 G IJ SOLR
1.0000 g | Freq: Once | INTRAMUSCULAR | Status: AC
  Administered 2016-09-17: 1 g via INTRAMUSCULAR

## 2016-09-18 NOTE — Patient Instructions (Signed)
Soak hand in warm soapy water twice daily. Rocephin 1 g IM given in office. Doxycycline 100 mg twice daily for 10 days. Keep clean and dry. Call if symptoms worsen.
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
## 2016-10-20 ENCOUNTER — Other Ambulatory Visit: Payer: Self-pay | Admitting: Internal Medicine

## 2016-10-20 DIAGNOSIS — E785 Hyperlipidemia, unspecified: Secondary | ICD-10-CM

## 2016-10-30 ENCOUNTER — Other Ambulatory Visit: Payer: Self-pay | Admitting: Nurse Practitioner

## 2016-11-12 ENCOUNTER — Other Ambulatory Visit: Payer: Self-pay | Admitting: Internal Medicine

## 2017-02-07 ENCOUNTER — Other Ambulatory Visit: Payer: Self-pay | Admitting: Internal Medicine

## 2017-03-12 ENCOUNTER — Other Ambulatory Visit: Payer: Self-pay

## 2017-03-12 MED ORDER — BUPROPION HCL ER (XL) 150 MG PO TB24: 150.0000 mg | ORAL_TABLET | Freq: Every day | ORAL | 3 refills | Status: DC

## 2017-05-03 ENCOUNTER — Other Ambulatory Visit: Payer: Self-pay | Admitting: Internal Medicine

## 2017-05-09 ENCOUNTER — Other Ambulatory Visit: Payer: Self-pay | Admitting: Internal Medicine

## 2017-05-21 ENCOUNTER — Other Ambulatory Visit: Payer: Self-pay

## 2017-05-21 DIAGNOSIS — I10 Essential (primary) hypertension: Secondary | ICD-10-CM

## 2017-05-21 DIAGNOSIS — Z125 Encounter for screening for malignant neoplasm of prostate: Secondary | ICD-10-CM

## 2017-05-21 DIAGNOSIS — N529 Male erectile dysfunction, unspecified: Secondary | ICD-10-CM

## 2017-05-21 DIAGNOSIS — Z0000 Encounter for general adult medical examination without abnormal findings: Secondary | ICD-10-CM

## 2017-05-21 DIAGNOSIS — E039 Hypothyroidism, unspecified: Secondary | ICD-10-CM

## 2017-05-21 DIAGNOSIS — E785 Hyperlipidemia, unspecified: Secondary | ICD-10-CM

## 2017-06-01 ENCOUNTER — Other Ambulatory Visit: Payer: Self-pay | Admitting: Internal Medicine

## 2017-06-01 DIAGNOSIS — Z87891 Personal history of nicotine dependence: Secondary | ICD-10-CM

## 2017-06-01 DIAGNOSIS — I10 Essential (primary) hypertension: Secondary | ICD-10-CM

## 2017-06-01 DIAGNOSIS — Z86010 Personal history of colonic polyps: Secondary | ICD-10-CM

## 2017-06-01 DIAGNOSIS — E039 Hypothyroidism, unspecified: Secondary | ICD-10-CM

## 2017-06-01 DIAGNOSIS — Z0000 Encounter for general adult medical examination without abnormal findings: Secondary | ICD-10-CM

## 2017-06-01 DIAGNOSIS — N529 Male erectile dysfunction, unspecified: Secondary | ICD-10-CM

## 2017-06-07 ENCOUNTER — Other Ambulatory Visit: Payer: BLUE CROSS/BLUE SHIELD | Admitting: Internal Medicine

## 2017-06-08 ENCOUNTER — Other Ambulatory Visit: Payer: BLUE CROSS/BLUE SHIELD | Admitting: Internal Medicine

## 2017-06-08 DIAGNOSIS — Z0000 Encounter for general adult medical examination without abnormal findings: Secondary | ICD-10-CM

## 2017-06-08 DIAGNOSIS — E785 Hyperlipidemia, unspecified: Secondary | ICD-10-CM | POA: Diagnosis not present

## 2017-06-08 DIAGNOSIS — Z125 Encounter for screening for malignant neoplasm of prostate: Secondary | ICD-10-CM | POA: Diagnosis not present

## 2017-06-08 DIAGNOSIS — I10 Essential (primary) hypertension: Secondary | ICD-10-CM

## 2017-06-08 DIAGNOSIS — E039 Hypothyroidism, unspecified: Secondary | ICD-10-CM | POA: Diagnosis not present

## 2017-06-08 DIAGNOSIS — N529 Male erectile dysfunction, unspecified: Secondary | ICD-10-CM

## 2017-06-08 LAB — COMPLETE METABOLIC PANEL WITH GFR
AG Ratio: 1.8 (calc) (ref 1.0–2.5)
ALBUMIN MSPROF: 4.3 g/dL (ref 3.6–5.1)
ALKALINE PHOSPHATASE (APISO): 79 U/L (ref 40–115)
ALT: 14 U/L (ref 9–46)
AST: 16 U/L (ref 10–35)
BUN: 16 mg/dL (ref 7–25)
CO2: 29 mmol/L (ref 20–32)
CREATININE: 1.14 mg/dL (ref 0.70–1.25)
Calcium: 9.4 mg/dL (ref 8.6–10.3)
Chloride: 104 mmol/L (ref 98–110)
GFR, EST AFRICAN AMERICAN: 78 mL/min/{1.73_m2} (ref >=60)
GFR, Est Non African American: 68 mL/min/{1.73_m2} (ref >=60)
Globulin: 2.4 g/dL (calc) (ref 1.9–3.7)
Glucose, Bld: 99 mg/dL (ref 65–99)
Potassium: 4.7 mmol/L (ref 3.5–5.3)
Sodium: 140 mmol/L (ref 135–146)
TOTAL PROTEIN: 6.7 g/dL (ref 6.1–8.1)
Total Bilirubin: 0.8 mg/dL (ref 0.2–1.2)

## 2017-06-08 LAB — CBC WITH DIFFERENTIAL/PLATELET
BASOS ABS: 83 {cells}/uL (ref 0–200)
BASOS PCT: 1.3 %
EOS ABS: 102 {cells}/uL (ref 15–500)
EOS PCT: 1.6 %
HEMATOCRIT: 44.6 % (ref 38.5–50.0)
HEMOGLOBIN: 15.3 g/dL (ref 13.2–17.1)
LYMPHS ABS: 1638 {cells}/uL (ref 850–3900)
MCH: 29.3 pg (ref 27.0–33.0)
MCHC: 34.3 g/dL (ref 32.0–36.0)
MCV: 85.3 fL (ref 80.0–100.0)
MPV: 11.5 fL (ref 7.5–12.5)
Monocytes Relative: 10.7 %
NEUTROS ABS: 3891 {cells}/uL (ref 1500–7800)
Neutrophils Relative %: 60.8 %
Platelets: 253 10*3/uL (ref 140–400)
RBC: 5.23 10*6/uL (ref 4.20–5.80)
RDW: 14.2 % (ref 11.0–15.0)
Total Lymphocyte: 25.6 %
WBC mixed population: 685 cells/uL (ref 200–950)
WBC: 6.4 10*3/uL (ref 3.8–10.8)

## 2017-06-08 LAB — LIPID PANEL
CHOLESTEROL: 190 mg/dL (ref <200)
HDL: 61 mg/dL (ref >40)
LDL Cholesterol (Calc): 114 mg/dL (calc) — ABNORMAL HIGH
NON-HDL CHOLESTEROL (CALC): 129 mg/dL (ref <130)
Total CHOL/HDL Ratio: 3.1 (calc) (ref <5.0)
Triglycerides: 60 mg/dL (ref <150)

## 2017-06-08 LAB — TSH: TSH: 0.95 m[IU]/L (ref 0.40–4.50)

## 2017-06-08 LAB — PSA: PSA: 1.2 ng/mL (ref <=4.0)

## 2017-06-10 ENCOUNTER — Encounter: Payer: BLUE CROSS/BLUE SHIELD | Admitting: Internal Medicine

## 2017-06-14 ENCOUNTER — Encounter: Payer: Self-pay | Admitting: Internal Medicine

## 2017-06-14 ENCOUNTER — Ambulatory Visit (INDEPENDENT_AMBULATORY_CARE_PROVIDER_SITE_OTHER): Payer: BLUE CROSS/BLUE SHIELD | Admitting: Internal Medicine

## 2017-06-14 VITALS — BP 128/80 | HR 88 | Ht 65.5 in | Wt 138.0 lb

## 2017-06-14 DIAGNOSIS — E039 Hypothyroidism, unspecified: Secondary | ICD-10-CM

## 2017-06-14 DIAGNOSIS — I10 Essential (primary) hypertension: Secondary | ICD-10-CM

## 2017-06-14 DIAGNOSIS — Z86010 Personal history of colonic polyps: Secondary | ICD-10-CM

## 2017-06-14 DIAGNOSIS — Z87891 Personal history of nicotine dependence: Secondary | ICD-10-CM

## 2017-06-14 DIAGNOSIS — E785 Hyperlipidemia, unspecified: Secondary | ICD-10-CM | POA: Diagnosis not present

## 2017-06-14 DIAGNOSIS — Z0000 Encounter for general adult medical examination without abnormal findings: Secondary | ICD-10-CM | POA: Diagnosis not present

## 2017-06-14 DIAGNOSIS — K219 Gastro-esophageal reflux disease without esophagitis: Secondary | ICD-10-CM | POA: Diagnosis not present

## 2017-06-14 LAB — POCT URINALYSIS DIPSTICK
Appearance: NORMAL
Bilirubin, UA: NEGATIVE
Glucose, UA: NEGATIVE
KETONES UA: NEGATIVE
LEUKOCYTES UA: NEGATIVE
NITRITE UA: NEGATIVE
Odor: NORMAL
PROTEIN UA: NEGATIVE
RBC UA: NEGATIVE
Spec Grav, UA: 1.01 (ref 1.010–1.025)
Urobilinogen, UA: 0.2 E.U./dL
pH, UA: 7.5 (ref 5.0–8.0)
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
## 2017-07-03 NOTE — Patient Instructions (Addendum)
It was a pleasure to see you today.  Continue same medications and follow-up in December for 27-month recheck.

## 2017-08-05 ENCOUNTER — Other Ambulatory Visit: Payer: Self-pay | Admitting: Internal Medicine

## 2017-08-28 ENCOUNTER — Other Ambulatory Visit: Payer: Self-pay | Admitting: Internal Medicine

## 2017-08-30 DIAGNOSIS — R51 Headache: Secondary | ICD-10-CM | POA: Diagnosis not present

## 2017-08-30 DIAGNOSIS — H2513 Age-related nuclear cataract, bilateral: Secondary | ICD-10-CM | POA: Diagnosis not present

## 2017-09-13 ENCOUNTER — Telehealth: Payer: Self-pay | Admitting: Internal Medicine

## 2017-09-13 NOTE — Telephone Encounter (Signed)
Spoke with patient to advise that he will need appointment to discuss travel plans with Dr. Renold Genta.  Patient can only come on a Monday.  Appointment made for 9/16 @ 11:45 a.m.  Patient confirmed.

## 2017-09-13 NOTE — Telephone Encounter (Signed)
He is going to be traveling to [place], [place] October 25th.  Patient wanted to know if he was up to date on all of his vaccines.  I advised that he call [employer] and find out exactly what all he would need.  He called back to say that they were not helpful.  He stated he had looked on the [facility] website and it looked like what he needed was possibly Typhoid, Hep A and Hep B.  States that he doesn't think he'll need Typhoid.  He has never had Hep A and Hep B.  Advised that he would need 2 doses of the Hep B.    Do you want him to come in for an OV to discuss?

## 2017-09-13 NOTE — Telephone Encounter (Signed)
We will repeat Hep B surface antibody. It was positive in 66 by old records and paper chart. I will write for oral typhoid vaccine. We should draw hepatitis A antibody prior to ordering vaccine. He should have OV to discuss this and other travel advice.

## 2017-09-20 ENCOUNTER — Ambulatory Visit (INDEPENDENT_AMBULATORY_CARE_PROVIDER_SITE_OTHER): Payer: BLUE CROSS/BLUE SHIELD | Admitting: Internal Medicine

## 2017-09-20 ENCOUNTER — Encounter: Payer: Self-pay | Admitting: Internal Medicine

## 2017-09-20 VITALS — BP 128/80 | HR 89 | Temp 98.1°F | Ht 65.5 in | Wt 137.0 lb

## 2017-09-20 DIAGNOSIS — Z7189 Other specified counseling: Secondary | ICD-10-CM

## 2017-09-20 DIAGNOSIS — Z7184 Encounter for health counseling related to travel: Secondary | ICD-10-CM

## 2017-09-20 MED ORDER — ONDANSETRON HCL 4 MG PO TABS: 4.0000 mg | ORAL_TABLET | Freq: Three times a day (TID) | ORAL | 0 refills | Status: DC | PRN

## 2017-09-20 MED ORDER — CIPROFLOXACIN HCL 500 MG PO TABS: 500.0000 mg | ORAL_TABLET | Freq: Two times a day (BID) | ORAL | 0 refills | Status: DC

## 2017-09-20 NOTE — Patient Instructions (Signed)
Have prescriptions for Cipro and Zofran field for travel.  Typhoid vaccine prescription written.  We will call you when hepatitis A vaccine comes in.  Labs drawn for hepatitis B surface antibody and hepatitis C antibody.

## 2017-09-20 NOTE — Progress Notes (Signed)
   Subjective:    Patient ID: Sean Weaver, male    DOB: 06-09-1955, 62 y.o.   MRN: [id number]  HPI He is planning to go to [place] in October to visit an online friend that he is been speaking with for about a year.  He looked on the [facility] website.  He says he had a vaccine recommended along with hep B vaccine and typhoid vaccine.  Given prescription today for typhoid oral vaccine which should be good for 5 years.  We will order hepatitis A vaccine for him but he will only get 1 dose before history of his second dose in 6 months.  Old records indicate he had a positive hepatitis B surface antibody in the 1990s.  This will be checked today.    Review of Systems see above     Objective:   Physical Exam Not examined but spent 20 minutes speaking with him about these issues.       Assessment & Plan:  Travel advice encounter  Plan: Order hep A vaccine.  Hepatitis B surface antibody checked today along with hepatitis C antibody.  He will be given a prescription for Cipro 500 mg twice daily for 10 days to take for travel and Zofran 4 mg tablets to take for nausea during his travel if needed.

## 2017-09-22 LAB — HEPATITIS C ANTIBODY
HEP C AB: NONREACTIVE
SIGNAL TO CUT-OFF: 0.02 (ref <1.00)

## 2017-09-22 LAB — HEPATITIS B SURFACE ANTIBODY,QUALITATIVE: Hep B S Ab: REACTIVE — AB

## 2017-09-23 ENCOUNTER — Encounter: Payer: Self-pay | Admitting: Internal Medicine

## 2017-09-23 ENCOUNTER — Ambulatory Visit (INDEPENDENT_AMBULATORY_CARE_PROVIDER_SITE_OTHER): Payer: BLUE CROSS/BLUE SHIELD | Admitting: Internal Medicine

## 2017-09-23 VITALS — HR 78 | Temp 98.3°F | Ht 65.5 in | Wt 137.0 lb

## 2017-09-23 DIAGNOSIS — Z23 Encounter for immunization: Secondary | ICD-10-CM

## 2017-09-23 NOTE — Progress Notes (Signed)
First Hep A vaccine given  Second dose due in 6 months

## 2017-09-26 NOTE — Patient Instructions (Signed)
Hepatitis A vaccine given Follow up booster needed in 6 months. Traveling to [place], [place].

## 2017-09-27 ENCOUNTER — Ambulatory Visit: Payer: BLUE CROSS/BLUE SHIELD | Admitting: Internal Medicine

## 2017-10-14 ENCOUNTER — Other Ambulatory Visit: Payer: Self-pay | Admitting: Internal Medicine

## 2017-10-14 DIAGNOSIS — E785 Hyperlipidemia, unspecified: Secondary | ICD-10-CM

## 2017-11-14 ENCOUNTER — Other Ambulatory Visit: Payer: Self-pay | Admitting: Internal Medicine

## 2017-11-21 ENCOUNTER — Other Ambulatory Visit: Payer: Self-pay | Admitting: Internal Medicine

## 2017-11-30 ENCOUNTER — Other Ambulatory Visit: Payer: Self-pay | Admitting: Internal Medicine

## 2017-11-30 DIAGNOSIS — Z5181 Encounter for therapeutic drug level monitoring: Secondary | ICD-10-CM

## 2017-11-30 DIAGNOSIS — Z79899 Other long term (current) drug therapy: Secondary | ICD-10-CM

## 2017-11-30 DIAGNOSIS — E039 Hypothyroidism, unspecified: Secondary | ICD-10-CM

## 2017-11-30 DIAGNOSIS — E785 Hyperlipidemia, unspecified: Secondary | ICD-10-CM

## 2017-12-06 ENCOUNTER — Other Ambulatory Visit: Payer: BLUE CROSS/BLUE SHIELD | Admitting: Internal Medicine

## 2017-12-06 DIAGNOSIS — E039 Hypothyroidism, unspecified: Secondary | ICD-10-CM | POA: Diagnosis not present

## 2017-12-06 DIAGNOSIS — E785 Hyperlipidemia, unspecified: Secondary | ICD-10-CM

## 2017-12-06 DIAGNOSIS — Z79899 Other long term (current) drug therapy: Secondary | ICD-10-CM | POA: Diagnosis not present

## 2017-12-06 DIAGNOSIS — Z5181 Encounter for therapeutic drug level monitoring: Secondary | ICD-10-CM | POA: Diagnosis not present

## 2017-12-07 LAB — HEPATIC FUNCTION PANEL
AG RATIO: 2 (calc) (ref 1.0–2.5)
ALKALINE PHOSPHATASE (APISO): 82 U/L (ref 40–115)
ALT: 16 U/L (ref 9–46)
AST: 20 U/L (ref 10–35)
Albumin: 4.3 g/dL (ref 3.6–5.1)
BILIRUBIN INDIRECT: 0.6 mg/dL (ref 0.2–1.2)
Bilirubin, Direct: 0.2 mg/dL (ref 0.0–0.2)
GLOBULIN: 2.1 g/dL (ref 1.9–3.7)
TOTAL PROTEIN: 6.4 g/dL (ref 6.1–8.1)
Total Bilirubin: 0.8 mg/dL (ref 0.2–1.2)

## 2017-12-07 LAB — LIPID PANEL
CHOL/HDL RATIO: 3.2 (calc) (ref <5.0)
Cholesterol: 194 mg/dL (ref <200)
HDL: 60 mg/dL (ref >40)
LDL Cholesterol (Calc): 117 mg/dL (calc) — ABNORMAL HIGH
NON-HDL CHOLESTEROL (CALC): 134 mg/dL — AB (ref <130)
Triglycerides: 76 mg/dL (ref <150)

## 2017-12-07 LAB — TSH: TSH: 1.27 m[IU]/L (ref 0.40–4.50)

## 2017-12-13 ENCOUNTER — Encounter: Payer: Self-pay | Admitting: Internal Medicine

## 2017-12-13 ENCOUNTER — Ambulatory Visit (INDEPENDENT_AMBULATORY_CARE_PROVIDER_SITE_OTHER): Payer: BLUE CROSS/BLUE SHIELD | Admitting: Internal Medicine

## 2017-12-13 VITALS — BP 130/70 | HR 78 | Ht 65.5 in | Wt 142.0 lb

## 2017-12-13 DIAGNOSIS — H53452 Other localized visual field defect, left eye: Secondary | ICD-10-CM

## 2017-12-13 DIAGNOSIS — E785 Hyperlipidemia, unspecified: Secondary | ICD-10-CM

## 2017-12-13 DIAGNOSIS — E039 Hypothyroidism, unspecified: Secondary | ICD-10-CM

## 2017-12-13 DIAGNOSIS — I10 Essential (primary) hypertension: Secondary | ICD-10-CM

## 2017-12-17 NOTE — Progress Notes (Signed)
   Subjective:    Patient ID: Sean Weaver, male    DOB: 1955-10-30, 62 y.o.   MRN: [id number]  HPI 62 year old White Male in today for 83-month recheck.  He has a history of hypertension hyperlipidemia and hypothyroidism.  He recently traveled to [place], [place] and enjoyed himself.  Prior to leaving was vaccinated for Hepatitis A  Recently he was trimming a client's hair and he experienced sudden loss of vision left lateral  eye for a brief period of time.  It was not associated with a headache.  He is going to have bilateral Doppler studies and be referred to neurologist.  He has a history of smoking.  In September 2016 , was diagnosed here with cluster headaches.  He had had several episodes of stabbing pain in his left eye followed by coryza and subsequent left-sided headache.  At times,  he had felt dizzy even without the headache.  One episode was associated with some visual disturbance in the left eye.  Gave him a short course of prednisone at that time gave him Relpax  at that time to take on a as needed basis.  CT of the brain without contrast was negative at that time.  CBC and sed rate at that time were negative.  He seldom takes medication for these headaches.  Social history: He is single.  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  Social alcohol consumption.      Review of Systems see above    Objective:   Physical Exam Skin warm and dry.  Nodes none.  PERRLA.  Extraocular movements are full.  Funduscopic exam is benign.  No facial weakness.  Cranial nerves II through XII are grossly intact.  Muscle strength in the upper and lower extremities is normal.  Gait is normal.       Assessment & Plan:  Essential hypertension-stable on current regimen  Hyperlipidemia-stable on statin.  LDL is 117.  Liver functions normal on statin.  Hypothyroidism-stable on thyroid replacement therapy.  TSH  1.27  Vision loss left lateral eye-?  Migraine versus thrombotic event.  Carotid Dopplers ordered.  Neurology consult ordered.

## 2017-12-20 DIAGNOSIS — L400 Psoriasis vulgaris: Secondary | ICD-10-CM | POA: Diagnosis not present

## 2017-12-20 DIAGNOSIS — D225 Melanocytic nevi of trunk: Secondary | ICD-10-CM | POA: Diagnosis not present

## 2017-12-20 DIAGNOSIS — L570 Actinic keratosis: Secondary | ICD-10-CM | POA: Diagnosis not present

## 2017-12-20 DIAGNOSIS — L821 Other seborrheic keratosis: Secondary | ICD-10-CM | POA: Diagnosis not present

## 2017-12-20 DIAGNOSIS — B353 Tinea pedis: Secondary | ICD-10-CM | POA: Diagnosis not present

## 2017-12-21 ENCOUNTER — Other Ambulatory Visit: Payer: Self-pay | Admitting: Internal Medicine

## 2017-12-21 DIAGNOSIS — H5450 Low vision, one eye, unspecified eye: Secondary | ICD-10-CM

## 2017-12-21 NOTE — Addendum Note (Signed)
Addended by: Mady Haagensen on: 12/21/2017 03:14 PM   Modules accepted: Orders

## 2017-12-27 ENCOUNTER — Ambulatory Visit (HOSPITAL_COMMUNITY)
Admission: RE | Admit: 2017-12-27 | Discharge: 2017-12-27 | Disposition: A | Discharge status: Home / Self Care | Payer: BLUE CROSS/BLUE SHIELD | Source: Ambulatory Visit | Attending: Internal Medicine | Admitting: Internal Medicine

## 2017-12-27 DIAGNOSIS — H5450 Low vision, one eye, unspecified eye: Secondary | ICD-10-CM | POA: Diagnosis not present

## 2017-12-27 NOTE — Progress Notes (Signed)
Carotid artery duplex completed. Refer to "CV Proc" under chart review to view preliminary results.  12/27/2017 1:20 PM Maudry Mayhew, MHA, RVT, RDCS, RDMS

## 2018-01-04 NOTE — Patient Instructions (Addendum)
Neurology consultation regarding peripheral vision loss left eye which was acute.  Continue same medications with physical exam in 6 months.

## 2018-01-24 ENCOUNTER — Encounter: Payer: Self-pay | Admitting: Neurology

## 2018-01-24 ENCOUNTER — Ambulatory Visit (INDEPENDENT_AMBULATORY_CARE_PROVIDER_SITE_OTHER): Payer: BLUE CROSS/BLUE SHIELD | Admitting: Neurology

## 2018-01-24 VITALS — BP 160/92 | HR 86 | Ht 65.5 in | Wt 140.0 lb

## 2018-01-24 DIAGNOSIS — I639 Cerebral infarction, unspecified: Secondary | ICD-10-CM | POA: Diagnosis not present

## 2018-01-24 DIAGNOSIS — H53122 Transient visual loss, left eye: Secondary | ICD-10-CM | POA: Diagnosis not present

## 2018-01-24 DIAGNOSIS — R42 Dizziness and giddiness: Secondary | ICD-10-CM | POA: Diagnosis not present

## 2018-01-24 NOTE — Progress Notes (Signed)
[facility]    Provider:  Dr Jaynee Eagles Referring Provider: Elby Showers, MD Primary Care Physician:  Elby Showers, MD  CC:  Vertigo, dizziness, loss of vision in the left eye  HPI:  Sean Weaver is a 63 y.o. male here as requested by Dr. Renold Genta for . Behind the left eye. Dizziness started a year ago. He has  A feeling of motion when there is none. He had peripheral vision loss. He never notices at night. Usually at work. Feel very motion sensitive. No migraines. The episodes last 10 minutes - 15 minutes. It happens once a week. Sitting makes it better. Not lightheaded. No chest pain or palpitations. He doesn't drink a lot of water.  Lost his vision like a black line across his eye and couldn;t see. Left peripheral vision. Happened 6 weeks ago and hasnt had that since., on the edge of the periphery. Usually he is standing. He has very poor insurance. MRI brain and MRA of the head. No other focal neurologic deficits, associated symptoms, inciting events or modifiable factors.   Review of Systems: Patient complains of symptoms per HPI as well as the following symptoms: vertigo, loss of vision. Pertinent negatives and positives per HPI. All others negative.   Social History   Socioeconomic History  . Marital status: Single    Spouse name: Not on file  . Number of children: 0  . Years of education: Not on file  . Highest education level: Some college, no degree  Occupational History  . Not on file  Social Needs  . Financial resource strain: Not on file  . Food insecurity:    Worry: Not on file    Inability: Not on file  . Transportation needs:    Medical: Not on file    Non-medical: Not on file  Tobacco Use  . Smoking status: Current Every Day Smoker    Types: Cigarettes  . Smokeless tobacco: Never Used  . Tobacco comment: 14 cigarettes per day  Substance and Sexual Activity  . Alcohol use: Yes    Alcohol/week: 14.0 standard drinks    Types: 14 Standard  drinks or equivalent per week  . Drug use: Yes    Frequency: 4.0 times per week    Types: Marijuana    Comment: "light usage"  . Sexual activity: Not on file  Lifestyle  . Physical activity:    Days per week: Not on file    Minutes per session: Not on file  . Stress: Not on file  Relationships  . Social connections:    Talks on phone: Not on file    Gets together: Not on file    Attends religious service: Not on file    Active member of club or organization: Not on file    Attends meetings of clubs or organizations: Not on file    Relationship status: Not on file  . Intimate partner violence:    Fear of current or ex partner: Not on file    Emotionally abused: Not on file    Physically abused: Not on file    Forced sexual activity: Not on file  Other Topics Concern  . Not on file  Social History Narrative   Lives at home alone    Right handed   Caffeine: 3-4 cups per day    Family History  Problem Relation Age of Onset  . Cancer Mother        ovarian  . Cancer Father  prostate   . Kidney failure Father   . Hypertension Sister   . Migraines Neg Hx   . Headache Neg Hx     Past Medical History:  Diagnosis Date  . GERD (gastroesophageal reflux disease)    resolved per pt report  . Hyperlipidemia   . Hypertension   . Psoriatic arthritis ([place])   . Thyroid disease   . Tubular adenoma     Past Surgical History:  Procedure Laterality Date  . dental procedure     removed tooth, will receive implant     Current Outpatient Medications  Medication Sig Dispense Refill  . atorvastatin (LIPITOR) 40 MG tablet TAKE 1 TABLET BY MOUTH DAILY. 90 tablet 3  . buPROPion (WELLBUTRIN XL) 150 MG 24 hr tablet Take 1 tablet (150 mg total) by mouth daily. 90 tablet 3  . cetirizine (ZYRTEC) 10 MG tablet Take 10 mg by mouth daily.    . ramipril (ALTACE) 2.5 MG capsule TAKE 1 CAPSULE BY MOUTH EVERY DAY 90 capsule 3  . sildenafil (REVATIO) 20 MG tablet TAKE UP TO 5 TABLETS  DAILY AS NEEDED 50 tablet prn  . SYNTHROID 100 MCG tablet TAKE 1 TABLET BY MOUTH EVERY DAY 90 tablet 1   No current facility-administered medications for this visit.     Allergies as of 01/24/2018 - Review Complete 01/24/2018  Allergen Reaction Noted  . Aspirin Swelling 08/15/2010    Vitals: BP (!) 160/92 (BP Location: Left Arm, Patient Position: Sitting)   Pulse 86   Ht 5' 5.5" (1.664 m)   Wt 140 lb (63.5 kg)   BMI 22.94 kg/m  Last Weight:  Wt Readings from Last 1 Encounters:  01/24/18 140 lb (63.5 kg)   Last Height:   Ht Readings from Last 1 Encounters:  01/24/18 5' 5.5" (1.664 m)   Physical exam: Exam: Gen: NAD, thin, cigarette smoker             CV: RRR, no MRG. No Carotid Bruits. No peripheral edema, warm, nontender Eyes: Conjunctivae clear without exudates or hemorrhage  Neuro: Detailed Neurologic Exam  Speech:    Speech is normal; fluent and spontaneous with normal comprehension.  Cognition:    The patient is oriented to person, place, and time;     recent and remote memory intact;     language fluent;     normal attention, concentration,     fund of knowledge Cranial Nerves:    The pupils are equal, round, and reactive to light. The fundi are normal and spontaneous venous pulsations are present. Visual fields are full to finger confrontation. Extraocular movements are intact. Trigeminal sensation is intact and the muscles of mastication are normal. The face is symmetric. The palate elevates in the midline. Hearing intact. Voice is normal. Shoulder shrug is normal. The tongue has normal motion without fasciculations.   Coordination:    Normal finger to nose and heel to shin. Normal rapid alternating movements.   Gait:    Heel-toe and tandem gait are normal.   Motor Observation:    No asymmetry, no atrophy, and no involuntary movements noted. Tone:    Normal muscle tone.    Posture:    Posture is normal. normal erect    Strength:    Strength is V/V  in the upper and lower limbs.      Sensation: intact to LT     Reflex Exam:  DTR's:    Deep tendon reflexes in the upper and lower extremities are normal  bilaterally.   Toes:    The toes are downgoing bilaterally.   Clonus:    Clonus is absent.       Assessment/Plan: This is a lovely 63 year old patient here for episodic vertigo with transient monocular vision loss.  Patient does have significant risk factors for stroke including chronic tobacco use, HTN.  However he also endorses decreased fluids which is also a cause of her dizziness.  However he is not orthostatic in the office today.  Feel patient needs stroke work-up versus TIA for very concerning symptoms transient monocular vision loss of the left eye and vertigo.  MRA of the head and MRI of the brain w/wo contrast: To evaluate for stroke and TIA, also to evaluate for other intracranial lesions that could be causing symptoms such as schwannoma or other intracranial etiology.  He has already completed a carotid Doppler.  He declines echocardiogram of the heart at this time have encouraged it and he may have this completed in April when he has Medicare.   If stroke confirmed, may consider Plavix (Allergy to ASA)  Asked him to hydrate well and also hold off on Sildenafil to see if either improve his symptoms. He does feel better with fluids, this may be dehydration( he reports he does run around all day and often does intake fluids) but given concerning symptom of monocular transient vision loss he needs a thorough evaluation.   ldl 117: Goal< 140. Discuss with pcp, aggressive control of vascular risk factors    Orders Placed This Encounter  Procedures  . MR BRAIN W WO CONTRAST  . MR MRA HEAD WO CONTRAST  . Hemoglobin A1c   I had a long d/w patient about his symptoms, risk for stroke/TIAs, personally independently reviewed imaging studies and stroke evaluation results and answered questions. May consider Plavix (allergic to  ASA) for stroke prevention and maintain strict control of hypertension with blood pressure goal below 130/90, diabetes with hemoglobin A1c goal below 6.5% and lipids with LDL cholesterol goal below 70 mg/dL. I also advised the patient to eat a healthy diet with plenty of whole grains, cereals, fruits and vegetables, exercise regularly and maintain ideal body weight .  Discussed smoking cessation.  Sarina Ill, MD  [facility] [address] [place], [place] [address]  Phone [phone] Fax [phone]  A total of 60 minutes was spent face-to-face with this patient. Over half this time was spent on counseling patient on the  1. Transient blindness of left eye   2. Vertigo   3. Cerebrovascular accident (CVA), unspecified mechanism ([place])   4. Dizziness     diagnosis and different diagnostic and therapeutic options, counseling and coordination of care, risks ans benefits of management, compliance, or risk factor reduction and education.

## 2018-01-24 NOTE — Patient Instructions (Addendum)
MRI of the brain and MRA of the head   Dizziness Dizziness is a common problem. It is a feeling of unsteadiness or light-headedness. You may feel like you are about to faint. Dizziness can lead to injury if you stumble or fall. Anyone can become dizzy, but dizziness is more common in older adults. This condition can be caused by a number of things, including medicines, dehydration, or illness. Follow these instructions at home: Eating and drinking  Drink enough fluid to keep your urine clear or pale yellow. This helps to keep you from becoming dehydrated. Try to drink more clear fluids, such as water.  Do not drink alcohol.  Limit your caffeine intake if told to do so by your health care provider. Check ingredients and nutrition facts to see if a food or beverage contains caffeine.  Limit your salt (sodium) intake if told to do so by your health care provider. Check ingredients and nutrition facts to see if a food or beverage contains sodium. Activity  Avoid making quick movements. ? Rise slowly from chairs and steady yourself until you feel okay. ? In the morning, first sit up on the side of the bed. When you feel okay, stand slowly while you hold onto something until you know that your balance is fine.  If you need to stand in one place for a long time, move your legs often. Tighten and relax the muscles in your legs while you are standing.  Do not drive or use heavy machinery if you feel dizzy.  Avoid bending down if you feel dizzy. Place items in your home so that they are easy for you to reach without leaning over. Lifestyle  Do not use any products that contain nicotine or tobacco, such as cigarettes and e-cigarettes. If you need help quitting, ask your health care provider.  Try to reduce your stress level by using methods such as yoga or meditation. Talk with your health care provider if you need help to manage your stress. General instructions  Watch your dizziness for any  changes.  Take over-the-counter and prescription medicines only as told by your health care provider. Talk with your health care provider if you think that your dizziness is caused by a medicine that you are taking.  Tell a friend or a family member that you are feeling dizzy. If he or she notices any changes in your behavior, have this person call your health care provider.  Keep all follow-up visits as told by your health care provider. This is important. Contact a health care provider if:  Your dizziness does not go away.  Your dizziness or light-headedness gets worse.  You feel nauseous.  You have reduced hearing.  You have new symptoms.  You are unsteady on your feet or you feel like the room is spinning. Get help right away if:  You vomit or have diarrhea and are unable to eat or drink anything.  You have problems talking, walking, swallowing, or using your arms, hands, or legs.  You feel generally weak.  You are not thinking clearly or you have trouble forming sentences. It may take a friend or family member to notice this.  You have chest pain, abdominal pain, shortness of breath, or sweating.  Your vision changes.  You have any bleeding.  You have a severe headache.  You have neck pain or a stiff neck.  You have a fever. These symptoms may represent a serious problem that is an emergency. Do not wait  to see if the symptoms will go away. Get medical help right away. Call your local emergency services (911 in the [place] Do not drive yourself to the hospital. Summary  Dizziness is a feeling of unsteadiness or light-headedness. This condition can be caused by a number of things, including medicines, dehydration, or illness.  Anyone can become dizzy, but dizziness is more common in older adults.  Drink enough fluid to keep your urine clear or pale yellow. Do not drink alcohol.  Avoid making quick movements if you feel dizzy. Monitor your dizziness for any  changes. This information is not intended to replace advice given to you by your health care provider. Make sure you discuss any questions you have with your health care provider. Document Released: 06/17/2000 Document Revised: 01/25/2016 Document Reviewed: 01/25/2016 Elsevier Interactive Patient Education  2019 [employer].    Syncope  Syncope refers to a condition in which a person temporarily loses consciousness. Syncope may also be called fainting or passing out. It is caused by a sudden decrease in blood flow to the brain. Even though most causes of syncope are not dangerous, syncope can be a sign of a serious medical problem. Your health care provider may do tests to find the reason why you are having syncope. Signs that you may be about to faint include:  Feeling dizzy or light-headed.  Feeling nauseous.  Seeing all white or all black in your field of vision.  Having cold, clammy skin. If you faint, get medical help right away. Call your local emergency services (911 in the [place] Do not drive yourself to the hospital. Follow these instructions at home: Pay attention to any changes in your symptoms. Take these actions to stay safe and to help relieve your symptoms: Lifestyle  Do not drive, use machinery, or play sports until your health care provider says it is okay.  Do not drink alcohol.  Do not use any products that contain nicotine or tobacco, such as cigarettes and e-cigarettes. If you need help quitting, ask your health care provider.  Drink enough fluid to keep your urine pale yellow. General instructions  Take over-the-counter and prescription medicines only as told by your health care provider.  If you are taking blood pressure or heart medicine, get up slowly and take several minutes to sit and then stand. This can reduce dizziness or light-headedness.  Have someone stay with you until you feel stable.  If you start to feel like you might faint, lie down  right away and raise (elevate) your feet above the level of your heart. Breathe deeply and steadily. Wait until all the symptoms have passed.  Keep all follow-up visits as told by your health care provider. This is important. Get help right away if you:  Have a severe headache.  Faint once or repeatedly.  Have pain in your chest, abdomen, or back.  Have a very fast or irregular heartbeat (palpitations).  Have pain when you breathe.  Are bleeding from your mouth or rectum, or you have black or tarry stool.  Have a seizure.  Are confused.  Have trouble walking.  Have severe weakness.  Have vision problems. These symptoms may represent a serious problem that is an emergency. Do not wait to see if your symptoms will go away. Get medical help right away. Call your local emergency services (911 in the [place] Do not drive yourself to the hospital. Summary  Syncope refers to a condition in which a person temporarily loses consciousness. It  is caused by a sudden decrease in blood flow to the brain.  Signs that you may be about to faint include dizziness, feeling light-headed, feeling nauseous, sudden vision changes, or cold, clammy skin.  Although most causes of syncope are not dangerous, syncope can be a sign of a serious medical problem. If you faint, get medical help right away. This information is not intended to replace advice given to you by your health care provider. Make sure you discuss any questions you have with your health care provider. Document Released: 12/22/2004 Document Revised: 11/30/2016 Document Reviewed: 11/30/2016 Elsevier Interactive Patient Education  2019 [employer].  Vertigo Vertigo is the feeling that you or your surroundings are moving when they are not. Vertigo can be dangerous if it occurs while you are doing something that could endanger you or others, such as driving. What are the causes? This condition is caused by a disturbance in the signals that  are sent by your body's sensory systems to your brain. Different causes of a disturbance can lead to vertigo, including:  Infections, especially in the inner ear.  A bad reaction to a drug, or misuse of alcohol and medicines.  Withdrawal from drugs or alcohol.  Quickly changing positions, as when lying down or rolling over in bed.  Migraine headaches.  Decreased blood flow to the brain.  Decreased blood pressure.  Increased pressure in the brain from a head or neck injury, stroke, infection, tumor, or bleeding.  Central nervous system disorders. What are the signs or symptoms? Symptoms of this condition usually occur when you move your head or your eyes in different directions. Symptoms may start suddenly, and they usually last for less than a minute. Symptoms may include:  Loss of balance and falling.  Feeling like you are spinning or moving.  Feeling like your surroundings are spinning or moving.  Nausea and vomiting.  Blurred vision or double vision.  Difficulty hearing.  Slurred speech.  Dizziness.  Involuntary eye movement (nystagmus). Symptoms can be mild and cause only slight annoyance, or they can be severe and interfere with daily life. Episodes of vertigo may return (recur) over time, and they are often triggered by certain movements. Symptoms may improve over time. How is this diagnosed? This condition may be diagnosed based on medical history and the quality of your nystagmus. Your health care provider may test your eye movements by asking you to quickly change positions to trigger the nystagmus. This may be called the Dix-Hallpike test, head thrust test, or roll test. You may be referred to a health care provider who specializes in ear, nose, and throat (ENT) problems (otolaryngologist) or a provider who specializes in disorders of the central nervous system (neurologist). You may have additional testing, including:  A physical exam.  Blood  tests.  MRI.  A CT scan.  An electrocardiogram (ECG). This records electrical activity in your heart.  An electroencephalogram (EEG). This records electrical activity in your brain.  Hearing tests. How is this treated? Treatment for this condition depends on the cause and the severity of the symptoms. Treatment options include:  Medicines to treat nausea or vertigo. These are usually used for severe cases. Some medicines that are used to treat other conditions may also reduce or eliminate vertigo symptoms. These include: ? Medicines that control allergies (antihistamines). ? Medicines that control seizures (anticonvulsants). ? Medicines that relieve depression (antidepressants). ? Medicines that relieve anxiety (sedatives).  Head movements to adjust your inner ear back to normal. If your  vertigo is caused by an ear problem, your health care provider may recommend certain movements to correct the problem.  Surgery. This is rare. Follow these instructions at home: Safety  Move slowly.Avoid sudden body or head movements.  Avoid driving.  Avoid operating heavy machinery.  Avoid doing any tasks that would cause danger to you or others if you would have a vertigo episode during the task.  If you have trouble walking or keeping your balance, try using a cane for stability. If you feel dizzy or unstable, sit down right away.  Return to your normal activities as told by your health care provider. Ask your health care provider what activities are safe for you. General instructions  Take over-the-counter and prescription medicines only as told by your health care provider.  Avoid certain positions or movements as told by your health care provider.  Drink enough fluid to keep your urine clear or pale yellow.  Keep all follow-up visits as told by your health care provider. This is important. Contact a health care provider if:  Your medicines do not relieve your vertigo or they make  it worse.  You have a fever.  Your condition gets worse or you develop new symptoms.  Your family or friends notice any behavioral changes.  Your nausea or vomiting gets worse.  You have numbness or a "pins and needles" sensation in part of your body. Get help right away if:  You have difficulty moving or speaking.  You are always dizzy.  You faint.  You develop severe headaches.  You have weakness in your hands, arms, or legs.  You have changes in your hearing or vision.  You develop a stiff neck.  You develop sensitivity to light. This information is not intended to replace advice given to you by your health care provider. Make sure you discuss any questions you have with your health care provider. Document Released: 10/01/2004 Document Revised: 06/05/2015 Document Reviewed: 04/16/2014 Elsevier Interactive Patient Education  2019 [employer].

## 2018-01-26 ENCOUNTER — Telehealth: Payer: Self-pay | Admitting: Neurology

## 2018-01-26 NOTE — Telephone Encounter (Signed)
Per Dr. Jaynee Eagles we are going to hold off on having the MRA Head done and just the MRI.Marland Kitchen He is scheduled for 02/01/18 at [facility].

## 2018-01-26 NOTE — Telephone Encounter (Signed)
lvm for pt to call back about scheduling mri  [employer] Auth: [id number] (exp. 01/26/18 to 02/24/18)

## 2018-02-01 ENCOUNTER — Ambulatory Visit (INDEPENDENT_AMBULATORY_CARE_PROVIDER_SITE_OTHER): Payer: BLUE CROSS/BLUE SHIELD

## 2018-02-01 DIAGNOSIS — I639 Cerebral infarction, unspecified: Secondary | ICD-10-CM | POA: Diagnosis not present

## 2018-02-01 DIAGNOSIS — R42 Dizziness and giddiness: Secondary | ICD-10-CM | POA: Diagnosis not present

## 2018-02-01 DIAGNOSIS — H53122 Transient visual loss, left eye: Secondary | ICD-10-CM

## 2018-02-01 MED ORDER — GADOBENATE DIMEGLUMINE 529 MG/ML IV SOLN
12.0000 mL | Freq: Once | INTRAVENOUS | Status: AC | PRN
  Administered 2018-02-01: 12 mL via INTRAVENOUS

## 2018-02-07 ENCOUNTER — Other Ambulatory Visit: Payer: Self-pay | Admitting: Neurology

## 2018-02-07 ENCOUNTER — Telehealth: Payer: Self-pay | Admitting: Neurology

## 2018-02-07 DIAGNOSIS — I639 Cerebral infarction, unspecified: Secondary | ICD-10-CM

## 2018-02-07 MED ORDER — CLOPIDOGREL BISULFATE 75 MG PO TABS: 75.0000 mg | ORAL_TABLET | Freq: Every day | ORAL | 11 refills | Status: DC

## 2018-02-07 NOTE — Telephone Encounter (Signed)
Pt states he is needing to be seen and he was under the impression that he had an appt on Mon. This appt had been canceled due to pt seen sooner. Next available appt is not till April. Please advise.

## 2018-02-07 NOTE — Telephone Encounter (Signed)
Please call patient and offer appointment with Dr. Jaynee Eagles for next Tuesday 02/15/2018 @ 11:30 arrival 11:00. There are no longer any openings on Monday the 10th.

## 2018-02-07 NOTE — Telephone Encounter (Signed)
Called patient to review results and left a message, will try back later  Abnormal MRI brain (with and without) demonstrating: - Chronic right occipital ischemic infarction.  This is a new finding  compared to CT on 10/08/2014. - Mild chronic small vessel ischemic disease. - No acute findings.

## 2018-02-07 NOTE — Telephone Encounter (Signed)
Pt has been scheduled. Will arrive at 11am for 2/11 11:30am appt.

## 2018-02-07 NOTE — Telephone Encounter (Signed)
I discussed with patient and we will be seeing him on Monday to discuss stroke workup. Start Plavix in the mean time (allergy to ASA). Will cc his primary care Dr. Renold Genta so she is aware of stroke.

## 2018-02-07 NOTE — Telephone Encounter (Signed)
Thanks so very much.

## 2018-02-07 NOTE — Telephone Encounter (Signed)
Noted thank you

## 2018-02-14 ENCOUNTER — Ambulatory Visit: Payer: BLUE CROSS/BLUE SHIELD | Admitting: Neurology

## 2018-02-15 ENCOUNTER — Encounter: Payer: Self-pay | Admitting: Neurology

## 2018-02-15 ENCOUNTER — Ambulatory Visit (INDEPENDENT_AMBULATORY_CARE_PROVIDER_SITE_OTHER): Payer: BLUE CROSS/BLUE SHIELD | Admitting: Neurology

## 2018-02-15 VITALS — BP 149/87 | HR 76

## 2018-02-15 DIAGNOSIS — I63431 Cerebral infarction due to embolism of right posterior cerebral artery: Secondary | ICD-10-CM

## 2018-02-15 NOTE — Patient Instructions (Addendum)
CT Angiogram of the head and neck (CT Scan) to look at blood vessels Trans Thoracic Echocardiogram with bubble study and then possible Transesophageal Echocardiogram 30-day heart monitor for paroxysmal atrial fibrillation May consider a loop recorder (Dr. Rayann Heman) Increase statin (goal ldl < 70) Check HgbA1c (3 month average for diabetes) Sleep study? Obstructive sleep apnea can cause stroke   Stroke Prevention Some medical conditions and behaviors are associated with a higher chance of having a stroke. You can help prevent a stroke by making nutrition, lifestyle, and other changes, including managing any medical conditions you may have. What nutrition changes can be made?   Eat healthy foods. You can do this by: ? Choosing foods high in fiber, such as fresh fruits and vegetables and whole grains. ? Eating at least 5 or more servings of fruits and vegetables a day. Try to fill half of your plate at each meal with fruits and vegetables. ? Choosing lean protein foods, such as lean cuts of meat, poultry without skin, fish, tofu, beans, and nuts. ? Eating low-fat dairy products. ? Avoiding foods that are high in salt (sodium). This can help lower blood pressure. ? Avoiding foods that have saturated fat, trans fat, and cholesterol. This can help prevent high cholesterol. ? Avoiding processed and premade foods.  Follow your health care provider's specific guidelines for losing weight, controlling high blood pressure (hypertension), lowering high cholesterol, and managing diabetes. These may include: ? Reducing your daily calorie intake. ? Limiting your daily sodium intake to 1,500 milligrams (mg). ? Using only healthy fats for cooking, such as olive oil, canola oil, or sunflower oil. ? Counting your daily carbohydrate intake. What lifestyle changes can be made?  Maintain a healthy weight. Talk to your health care provider about your ideal weight.  Get at least 30 minutes of moderate physical  activity at least 5 days a week. Moderate activity includes brisk walking, biking, and swimming.  Do not use any products that contain nicotine or tobacco, such as cigarettes and e-cigarettes. If you need help quitting, ask your health care provider. It may also be helpful to avoid exposure to secondhand smoke.  Limit alcohol intake to no more than 1 drink a day for nonpregnant women and 2 drinks a day for men. One drink equals 12 oz of beer, 5 oz of wine, or 1 oz of hard liquor.  Stop any illegal drug use.  Avoid taking birth control pills. Talk to your health care provider about the risks of taking birth control pills if: ? You are over 17 years old. ? You smoke. ? You get migraines. ? You have ever had a blood clot. What other changes can be made?  Manage your cholesterol levels. ? Eating a healthy diet is important for preventing high cholesterol. If cholesterol cannot be managed through diet alone, you may also need to take medicines. ? Take any prescribed medicines to control your cholesterol as told by your health care provider.  Manage your diabetes. ? Eating a healthy diet and exercising regularly are important parts of managing your blood sugar. If your blood sugar cannot be managed through diet and exercise, you may need to take medicines. ? Take any prescribed medicines to control your diabetes as told by your health care provider.  Control your hypertension. ? To reduce your risk of stroke, try to keep your blood pressure below 130/80. ? Eating a healthy diet and exercising regularly are an important part of controlling your blood pressure. If your blood  pressure cannot be managed through diet and exercise, you may need to take medicines. ? Take any prescribed medicines to control hypertension as told by your health care provider. ? Ask your health care provider if you should monitor your blood pressure at home. ? Have your blood pressure checked every year, even if your  blood pressure is normal. Blood pressure increases with age and some medical conditions.  Get evaluated for sleep disorders (sleep apnea). Talk to your health care provider about getting a sleep evaluation if you snore a lot or have excessive sleepiness.  Take over-the-counter and prescription medicines only as told by your health care provider. Aspirin or blood thinners (antiplatelets or anticoagulants) may be recommended to reduce your risk of forming blood clots that can lead to stroke.  Make sure that any other medical conditions you have, such as atrial fibrillation or atherosclerosis, are managed. What are the warning signs of a stroke? The warning signs of a stroke can be easily remembered as BEFAST.  B is for balance. Signs include: ? Dizziness. ? Loss of balance or coordination. ? Sudden trouble walking.  E is for eyes. Signs include: ? A sudden change in vision. ? Trouble seeing.  F is for face. Signs include: ? Sudden weakness or numbness of the face. ? The face or eyelid drooping to one side.  A is for arms. Signs include: ? Sudden weakness or numbness of the arm, usually on one side of the body.  S is for speech. Signs include: ? Trouble speaking (aphasia). ? Trouble understanding.  T is for time. ? These symptoms may represent a serious problem that is an emergency. Do not wait to see if the symptoms will go away. Get medical help right away. Call your local emergency services (911 in the [place] Do not drive yourself to the hospital.  Other signs of stroke may include: ? A sudden, severe headache with no known cause. ? Nausea or vomiting. ? Seizure. Where to find more information For more information, visit:  American Stroke Association: www.strokeassociation.org  National Stroke Association: www.stroke.org Summary  You can prevent a stroke by eating healthy, exercising, not smoking, limiting alcohol intake, and managing any medical conditions you may  have.  Do not use any products that contain nicotine or tobacco, such as cigarettes and e-cigarettes. If you need help quitting, ask your health care provider. It may also be helpful to avoid exposure to secondhand smoke.  Remember BEFAST for warning signs of stroke. Get help right away if you or a loved one has any of these signs. This information is not intended to replace advice given to you by your health care provider. Make sure you discuss any questions you have with your health care provider. Document Released: 01/30/2004 Document Revised: 01/28/2016 Document Reviewed: 01/28/2016 Elsevier Interactive Patient Education  2019 [employer].

## 2018-02-19 DIAGNOSIS — I63431 Cerebral infarction due to embolism of right posterior cerebral artery: Secondary | ICD-10-CM | POA: Insufficient documentation

## 2018-02-19 NOTE — Progress Notes (Addendum)
[facility]    Provider:  Dr Jaynee Eagles Referring Provider: Elby Showers, MD Primary Care Physician:  Elby Showers, MD  CC:  Vertigo, dizziness, loss of vision in the left eye  Interval history 02/19/2018: I reviewed MRI images with patient which showed a distal PCA embolic stroke likely happened on Deceber 6th of 2019 when he experienced monocular vision loss. Also reviewed with neuroradiology and Dr. Leonie Man, vascular stroke physician, who also agree this appears embolic. Had a long discussion with patient on the plan. He will have better insurance in 6 weeks and we will proceed with a stroke workup at that time. See assessment and plan for orders.  HPI:  Sean Weaver is a 63 y.o. male here as requested by Dr. Renold Genta for . Behind the left eye. Dizziness started a year ago. He has  A feeling of motion when there is none. He had peripheral vision loss. He never notices at night. Usually at work. Feel very motion sensitive. No migraines. The episodes last 10 minutes - 15 minutes. It happens once a week. Sitting makes it better. Not lightheaded. No chest pain or palpitations. He doesn't drink a lot of water.  Lost his vision like a black line across his eye and couldn;t see. Left peripheral vision. Happened 6 weeks ago and hasnt had that since., on the edge of the periphery. Usually he is standing. He has very poor insurance. MRI brain and MRA of the head. No other focal neurologic deficits, associated symptoms, inciting events or modifiable factors.   Review of Systems: Patient complains of symptoms per HPI as well as the following symptoms: vertigo, loss of vision. Pertinent negatives and positives per HPI. All others negative.   Social History   Socioeconomic History  . Marital status: Single    Spouse name: Not on file  . Number of children: 0  . Years of education: Not on file  . Highest education level: Some college, no degree  Occupational History  . Not on file   Social Needs  . Financial resource strain: Not on file  . Food insecurity:    Worry: Not on file    Inability: Not on file  . Transportation needs:    Medical: Not on file    Non-medical: Not on file  Tobacco Use  . Smoking status: Current Every Day Smoker    Types: Cigarettes  . Smokeless tobacco: Never Used  . Tobacco comment: 14 cigarettes per day  Substance and Sexual Activity  . Alcohol use: Yes    Alcohol/week: 14.0 standard drinks    Types: 14 Standard drinks or equivalent per week  . Drug use: Yes    Frequency: 4.0 times per week    Types: Marijuana    Comment: "light usage"  . Sexual activity: Not on file  Lifestyle  . Physical activity:    Days per week: Not on file    Minutes per session: Not on file  . Stress: Not on file  Relationships  . Social connections:    Talks on phone: Not on file    Gets together: Not on file    Attends religious service: Not on file    Active member of club or organization: Not on file    Attends meetings of clubs or organizations: Not on file    Relationship status: Not on file  . Intimate partner violence:    Fear of current or ex partner: Not on file    Emotionally abused:  Not on file    Physically abused: Not on file    Forced sexual activity: Not on file  Other Topics Concern  . Not on file  Social History Narrative   Lives at home alone    Right handed   Caffeine: 3-4 cups per day    Family History  Problem Relation Age of Onset  . Cancer Mother        ovarian  . Cancer Father        prostate   . Kidney failure Father   . Hypertension Sister   . Migraines Neg Hx   . Headache Neg Hx     Past Medical History:  Diagnosis Date  . GERD (gastroesophageal reflux disease)    resolved per pt report  . Hyperlipidemia   . Hypertension   . Psoriatic arthritis ([place])   . Thyroid disease   . Tubular adenoma     Past Surgical History:  Procedure Laterality Date  . dental procedure     removed tooth, will receive  implant     Current Outpatient Medications  Medication Sig Dispense Refill  . buPROPion (WELLBUTRIN XL) 150 MG 24 hr tablet Take 1 tablet (150 mg total) by mouth daily. 90 tablet 3  . cetirizine (ZYRTEC) 10 MG tablet Take 10 mg by mouth daily.    . clopidogrel (PLAVIX) 75 MG tablet Take 1 tablet (75 mg total) by mouth daily. 30 tablet 11  . ramipril (ALTACE) 2.5 MG capsule TAKE 1 CAPSULE BY MOUTH EVERY DAY 90 capsule 3  . sildenafil (REVATIO) 20 MG tablet TAKE UP TO 5 TABLETS DAILY AS NEEDED 50 tablet prn  . SYNTHROID 100 MCG tablet TAKE 1 TABLET BY MOUTH EVERY DAY 90 tablet 1   No current facility-administered medications for this visit.     Allergies as of 02/15/2018 - Review Complete 02/15/2018  Allergen Reaction Noted  . Aspirin Swelling 08/15/2010    Vitals: BP (!) 149/87 (BP Location: Right Arm, Patient Position: Sitting)   Pulse 76  Last Weight:  Wt Readings from Last 1 Encounters:  01/24/18 140 lb (63.5 kg)   Last Height:   Ht Readings from Last 1 Encounters:  01/24/18 5' 5.5" (1.664 m)   Physical exam: Exam: Gen: NAD, thin, cigarette smoker             CV: RRR, no MRG. No Carotid Bruits. No peripheral edema, warm, nontender Eyes: Conjunctivae clear without exudates or hemorrhage  Neuro: Detailed Neurologic Exam  Speech:    Speech is normal; fluent and spontaneous with normal comprehension.  Cognition:    The patient is oriented to person, place, and time;     recent and remote memory intact;     language fluent;     normal attention, concentration,     fund of knowledge Cranial Nerves:    The pupils are equal, round, and reactive to light. The fundi are normal and spontaneous venous pulsations are present. Visual fields are full to finger confrontation. Extraocular movements are intact. Trigeminal sensation is intact and the muscles of mastication are normal. The face is symmetric. The palate elevates in the midline. Hearing intact. Voice is normal. Shoulder  shrug is normal. The tongue has normal motion without fasciculations.   Coordination:    Normal finger to nose and heel to shin. Normal rapid alternating movements.   Gait:    Heel-toe and tandem gait are normal.   Motor Observation:    No asymmetry, no atrophy, and no  involuntary movements noted. Tone:    Normal muscle tone.    Posture:    Posture is normal. normal erect    Strength:    Strength is V/V in the upper and lower limbs.      Sensation: intact to LT     Reflex Exam:  DTR's:    Deep tendon reflexes in the upper and lower extremities are normal bilaterally.   Toes:    The toes are downgoing bilaterally.   Clonus:    Clonus is absent.       Assessment/Plan: This is a lovely 63 year old patient here for episodic vertigo with transient monocular vision loss.  Patient does have significant risk factors for stroke including chronic tobacco use, HTN.    MRI images with patient which showed a distal PCA embolic stroke. Also reviewed with neuroradiology and Dr. Leonie Man, vascular stroke physician, who also agree this appears embolic. Right occipital ischemic infarct would fit his symptoms that occurred several months prior; I suspect this stroke is from December 10 2017 when he had his monocular vision loss.   - Patient will need a TTE and may also need a TEE and loop. In the meantime he may also need a 30-day heart monitor before proceeding to loop. If pfo found may consider dopplers of lower extremities however unclear of diagnostic value since stroke is chronic at this point. I will refer to Cardiology Dr. Rayann Heman for evaluation and treatment.   - CTA of the head and neck  - Continue Plavix      ldl 117: Goal< 70. Discuss with pcp, aggressive control of vascular risk factors. Repeat lipid panel.   Discussed smoking cessation  I had a long d/w patient about his recent stroke, risk for recurrent stroke/TIAs, personally independently reviewed imaging studies and stroke  evaluation results and answered questions.Continue Plavix for secondary stroke prevention and maintain strict control of hypertension with blood pressure goal below 130/90, diabetes with hemoglobin A1c goal below 6.5% and lipids with LDL cholesterol goal below 70 mg/dL. I also advised the patient to eat a healthy diet with plenty of whole grains, cereals, fruits and vegetables, exercise regularly and maintain ideal body weight and to stop smoking.   Orders Placed This Encounter  Procedures  . CT ANGIO NECK W OR WO CONTRAST  . CT ANGIO HEAD W OR WO CONTRAST  . Hemoglobin A1c  . Basic Metabolic Panel  . Lipid panel  . Ambulatory referral to Cardiology    Sarina Ill, MD  [facility] [address] [place], [place] [address]  Phone [phone] Fax [phone]  A total of 40 minutes was spent face-to-face with this patient. Over half this time was spent on counseling patient on the  1. 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

## 2018-02-19 NOTE — Progress Notes (Signed)
Thank you :)

## 2018-02-21 ENCOUNTER — Other Ambulatory Visit (INDEPENDENT_AMBULATORY_CARE_PROVIDER_SITE_OTHER): Payer: Self-pay

## 2018-02-21 DIAGNOSIS — I63431 Cerebral infarction due to embolism of right posterior cerebral artery: Secondary | ICD-10-CM

## 2018-02-21 DIAGNOSIS — Z0289 Encounter for other administrative examinations: Secondary | ICD-10-CM

## 2018-02-22 ENCOUNTER — Telehealth: Payer: Self-pay | Admitting: Neurology

## 2018-02-22 LAB — LIPID PANEL
Chol/HDL Ratio: 2.7 ratio (ref 0.0–5.0)
Cholesterol, Total: 180 mg/dL (ref 100–199)
HDL: 66 mg/dL (ref >39)
LDL Calculated: 102 mg/dL — ABNORMAL HIGH (ref 0–99)
Triglycerides: 60 mg/dL (ref 0–149)
VLDL Cholesterol Cal: 12 mg/dL (ref 5–40)

## 2018-02-22 LAB — BASIC METABOLIC PANEL
BUN/Creatinine Ratio: 13 (ref 10–24)
BUN: 17 mg/dL (ref 8–27)
CO2: 23 mmol/L (ref 20–29)
Calcium: 9.4 mg/dL (ref 8.6–10.2)
Chloride: 100 mmol/L (ref 96–106)
Creatinine, Ser: 1.31 mg/dL — ABNORMAL HIGH (ref 0.76–1.27)
GFR calc Af Amer: 66 mL/min/{1.73_m2} (ref >59)
GFR calc non Af Amer: 57 mL/min/{1.73_m2} — ABNORMAL LOW (ref >59)
GLUCOSE: 92 mg/dL (ref 65–99)
Potassium: 4.7 mmol/L (ref 3.5–5.2)
SODIUM: 140 mmol/L (ref 134–144)

## 2018-02-22 LAB — HEMOGLOBIN A1C
Est. average glucose Bld gHb Est-mCnc: 120 mg/dL
Hgb A1c MFr Bld: 5.8 % — ABNORMAL HIGH (ref 4.8–5.6)

## 2018-02-22 NOTE — Telephone Encounter (Signed)
[employer] Auth: [id number] (exp. 02/22/18 to 03/23/18) order sent to GI. They will reach out to the pt to schedule.

## 2018-02-23 ENCOUNTER — Telehealth: Payer: Self-pay

## 2018-02-23 DIAGNOSIS — I63431 Cerebral infarction due to embolism of right posterior cerebral artery: Secondary | ICD-10-CM

## 2018-02-23 NOTE — Telephone Encounter (Signed)
-----   Message from Thompson Grayer, MD sent at 02/22/2018  7:28 AM EST ----- Regarding: FW: patient referral to you Please schedule an office visit with me as a new patient consult.  Order 2D echo with bubble study prior to visit if we can for stroke eval.  ----- Message ----- From: Melvenia Beam, MD Sent: 02/19/2018   9:13 AM EST To: Thompson Grayer, MD Subject: patient referral to you                        Dr. Rayann Heman: This patient had a right occipital distal pca embolic stroke. It is chronic now but I suspect he had it in Novemer 2019 because it would fit the symptoms he had at that time. He will need a complete cardiology workup including TTE, maybe TEE and loop, or cardiac monitor. Can I send a referral to you and have you see him in the office so you can direct the entire cardiovascular workup? Or would you like me to order all the tests and refer to you as needed?  Let me know. In the meantime will send a referral to you. Thank you ! [place] Dom

## 2018-02-23 NOTE — Telephone Encounter (Signed)
Order entered for ECHO with bubble study.

## 2018-02-24 MED ORDER — ATORVASTATIN CALCIUM 80 MG PO TABS: 80.0000 mg | ORAL_TABLET | Freq: Every day | ORAL | 3 refills | Status: DC

## 2018-02-24 NOTE — Telephone Encounter (Signed)
There is no 60 mg Atoravastin. Best go up to 80 mg daily. You can take 2 tabs of what you have left and I will call you in a new Rx for 80 mg daily

## 2018-03-08 ENCOUNTER — Telehealth: Payer: Self-pay | Admitting: *Deleted

## 2018-03-08 NOTE — Telephone Encounter (Signed)
Received dental request from [facility] asking if pt needs pre-medication for dental treatment. Form signed by MD and faxed back to the dentist. Patient is on daily plavix 75 mg but does not need pre-medication for his dental procedures. Received a receipt of confirmation.

## 2018-03-09 ENCOUNTER — Institutional Professional Consult (permissible substitution): Payer: BLUE CROSS/BLUE SHIELD | Admitting: Internal Medicine

## 2018-04-07 ENCOUNTER — Other Ambulatory Visit: Payer: BLUE CROSS/BLUE SHIELD

## 2018-04-08 ENCOUNTER — Telehealth: Payer: Self-pay | Admitting: Cardiology

## 2018-04-08 NOTE — Telephone Encounter (Signed)
   Reviewed chart.  With recent stroke, proceeding with echocardiogram with bubble study seems appropriate.  Candee Furbish, MD

## 2018-04-12 ENCOUNTER — Ambulatory Visit (HOSPITAL_COMMUNITY): Payer: Medicare Other | Attending: Cardiology

## 2018-04-12 ENCOUNTER — Other Ambulatory Visit: Payer: Self-pay

## 2018-04-12 DIAGNOSIS — I63431 Cerebral infarction due to embolism of right posterior cerebral artery: Secondary | ICD-10-CM | POA: Insufficient documentation

## 2018-04-18 ENCOUNTER — Telehealth (INDEPENDENT_AMBULATORY_CARE_PROVIDER_SITE_OTHER): Payer: Medicare Other | Admitting: Internal Medicine

## 2018-04-18 ENCOUNTER — Encounter: Payer: Self-pay | Admitting: Internal Medicine

## 2018-04-18 VITALS — BP 131/84

## 2018-04-18 DIAGNOSIS — I10 Essential (primary) hypertension: Secondary | ICD-10-CM

## 2018-04-18 DIAGNOSIS — I63431 Cerebral infarction due to embolism of right posterior cerebral artery: Secondary | ICD-10-CM | POA: Diagnosis not present

## 2018-04-18 DIAGNOSIS — E7800 Pure hypercholesterolemia, unspecified: Secondary | ICD-10-CM

## 2018-04-18 NOTE — Progress Notes (Signed)
Electrophysiology TeleHealth Note   Due to national recommendations of social distancing due to [place] 19, Audio/video telehealth visit is felt to be most appropriate for this patient at this time.  See MyChart message from today for patient consent regarding telehealth for [name].   Date:  04/18/2018   ID:  Sean Weaver, DOB [birth year], MRN [id number]  Location: home Provider location: [address] [place] Evaluation Performed: New patient consult  PCP:  Elby Showers, MD   Chief Complaint:  stroke  History of Present Illness:    Sean Weaver is a 63 y.o. male who presents via audio/video conferencing for a telehealth visit today.   The patient is referred for new consultation regarding stroke evalution by Dr Jaynee Eagles.  He reports     Today, he denies symptoms of palpitations, chest pain, shortness of breath, orthopnea, PND, lower extremity edema, claudication, dizziness, presyncope, syncope, bleeding, or neurologic sequela. The patient is tolerating medications without difficulties and is otherwise without complaint today.   he denies symptoms of cough, fevers, chills, or new SOB worrisome for COVID 19.   Past Medical History:  Diagnosis Date  . GERD (gastroesophageal reflux disease)    resolved per pt report  . Hyperlipidemia   . Hypertension   . Hypothyroidism   . Psoriatic arthritis ([place])   . Stroke (cerebrum) ([place])   . Tubular adenoma     Past Surgical History:  Procedure Laterality Date  . dental procedure     removed tooth, will receive implant     Current Outpatient Medications  Medication Sig Dispense Refill  . atorvastatin (LIPITOR) 80 MG tablet Take 1 tablet (80 mg total) by mouth daily. 90 tablet 3  . buPROPion (WELLBUTRIN XL) 150 MG 24 hr tablet Take 1 tablet (150 mg total) by mouth daily. 90 tablet 3  . cetirizine (ZYRTEC) 10 MG tablet Take 10 mg by mouth daily.    . clopidogrel (PLAVIX) 75 MG tablet Take 1 tablet (75 mg  total) by mouth daily. 30 tablet 11  . ramipril (ALTACE) 2.5 MG capsule TAKE 1 CAPSULE BY MOUTH EVERY DAY 90 capsule 3  . sildenafil (REVATIO) 20 MG tablet TAKE UP TO 5 TABLETS DAILY AS NEEDED 50 tablet prn  . SYNTHROID 100 MCG tablet TAKE 1 TABLET BY MOUTH EVERY DAY 90 tablet 1   No current facility-administered medications for this visit.     Allergies:   Aspirin   Social History:  The patient  reports that he has been smoking cigarettes. He has never used smokeless tobacco. He reports current alcohol use of about 14.0 standard drinks of alcohol per week. He reports current drug use. Frequency: 4.00 times per week. Drug: Marijuana.   Family History:  The patient's  family history includes Cancer in his father and mother; Hypertension in his sister; Kidney failure in his father.    ROS:  Please see the history of present illness.   All other systems are personally reviewed and negative.    Exam:    Vital Signs:  BP 131/84   Well appearing, alert and conversant, regular work of breathing,  good skin color Eyes- anicteric, neuro- grossly intact, skin- no apparent rash or lesions or cyanosis, mouth- oral mucosa is pink   Labs/Other Tests and Data Reviewed:    Recent Labs: 06/08/2017: Hemoglobin 15.3; Platelets 253 12/06/2017: ALT 16; TSH 1.27 02/21/2018: BUN 17; Creatinine, Ser 1.31; Potassium 4.7; Sodium 140   Wt Readings from  Last 3 Encounters:  01/24/18 140 lb (63.5 kg)  12/13/17 142 lb (64.4 kg)  09/23/17 137 lb (62.1 kg)     Other studies personally reviewed: Additional studies/ records that were reviewed today include: Dr Ferdinand Lango notes, prior MRI, echo, PCPs notes  Review of the above records today demonstrates: as above Prior radiographs: MRI 02/01/2018   ASSESSMENT & PLAN:    1.  Stroke MRI 02/01/2018 is personally reviewed and reveals stroke.  I agree with Dr Jaynee Eagles that cardioembolic source should be considered.  Echo did not reveal a cause.  I have discussed  monitoring for atrial arrhythmias with the patient today.  Options of 30 day monitor and long term monitoring with an ILR were discussed at length.  Risks and benefits to ILR implant were discussed with the patient today who wishes to proceed.  We will arrange in office ILR placement after COVID 19 has passed.  2. Tobacco Cessation advised  3. HTN Stable No change required today  4. HL Stable No change required today  5. RA eustachian ridge Prominent on echo Not a cause for stroke  No further workup as per Dr Aundra Dubin  5. COVID screen The patient does not have any symptoms that suggest any further testing/ screening at this time.  Social distancing reinforced today.  Follow-up:  We will arrange in office ILR implant after COVID 19.  Current medicines are reviewed at length with the patient today.   The patient does not have concerns regarding his medicines.  The following changes were made today:  none  Patient Risk:  after full review of this patients clinical status, I feel that they are at moderate risk at this time.   Today, I have spent 20 minutes with the patient with telehealth technology discussing stroke .    Signed, Thompson Grayer MD, [place] 04/18/2018 9:18 AM   [facility] [address] [place] [address] [phone] (office) [phone] (fax)

## 2018-04-25 ENCOUNTER — Institutional Professional Consult (permissible substitution): Payer: BLUE CROSS/BLUE SHIELD | Admitting: Internal Medicine

## 2018-04-26 ENCOUNTER — Other Ambulatory Visit: Payer: Self-pay

## 2018-04-26 MED ORDER — RAMIPRIL 2.5 MG PO CAPS: ORAL_CAPSULE | ORAL | 0 refills | Status: DC

## 2018-04-26 MED ORDER — SYNTHROID 100 MCG PO TABS: 100.0000 ug | ORAL_TABLET | Freq: Every day | ORAL | 1 refills | Status: DC

## 2018-04-26 MED ORDER — BUPROPION HCL ER (XL) 150 MG PO TB24: 150.0000 mg | ORAL_TABLET | Freq: Every day | ORAL | 1 refills | Status: DC

## 2018-04-26 MED ORDER — ATORVASTATIN CALCIUM 40 MG PO TABS: 40.0000 mg | ORAL_TABLET | Freq: Every day | ORAL | 1 refills | Status: DC

## 2018-05-03 ENCOUNTER — Encounter: Payer: Self-pay | Admitting: Neurology

## 2018-05-03 ENCOUNTER — Telehealth: Payer: Self-pay | Admitting: Neurology

## 2018-05-03 NOTE — Telephone Encounter (Signed)
pt asked to download cisco webex app/program https://www.webex.com/downloads.html  Pt understands that although there may be some limitations with this type of visit, we will take all precautions to reduce any security or privacy concerns.  Pt understands that this will be treated like an in office visit and we will file with pt's insurance, and there may be a patient responsible charge related to this service. Pt's email is [email or web address]. Pt understands that the [employer]co webex software must be downloaded and operational on the device pt plans to use for the visit.

## 2018-05-03 NOTE — Telephone Encounter (Signed)
Meeting number (access code): [phone] Meeting password: [email or web address] [phone]  Appt is being changed to vv with pt's consent d/t the covid 19 pandemic; office is severely reducing in person visits at this time and converting to telemedicine for the safety of our patients and staff.

## 2018-05-05 ENCOUNTER — Encounter: Payer: Self-pay | Admitting: Neurology

## 2018-05-05 NOTE — Telephone Encounter (Signed)
I spoke with the patient. Confirmed pt using 2 identifiers. Updated pt's chart. No changes to PMH or meds. Pt stated he has had a change in insurance. Will provide this during his check-in call he will receive on Monday before the virtual appt.

## 2018-05-09 ENCOUNTER — Other Ambulatory Visit: Payer: Self-pay

## 2018-05-09 ENCOUNTER — Encounter: Payer: Self-pay | Admitting: Neurology

## 2018-05-09 ENCOUNTER — Ambulatory Visit (INDEPENDENT_AMBULATORY_CARE_PROVIDER_SITE_OTHER): Payer: Medicare Other | Admitting: Neurology

## 2018-05-09 DIAGNOSIS — I63431 Cerebral infarction due to embolism of right posterior cerebral artery: Secondary | ICD-10-CM | POA: Diagnosis not present

## 2018-05-09 NOTE — Progress Notes (Signed)
[facility]    Provider:  Dr Jaynee Eagles Referring Provider: Elby Showers, MD Primary Care Physician:  Elby Showers, MD  CC:  Cryptogenic Embolic Stroke  Virtual Visit via Video Note 05/09/2018: Discussed workup to date with patient.   Ldl: 102. Could not tolerate increase in statin. Goal is < 70, we will follow as he changes his diet and exercise routines. Saw dietician. HgbA1c: 5.8, controlled, saw dietician. Follow with Tommie Ard Baxley Carotid Dopplers: 1-39% bilaterally CTA head and neck: Pending Loop: Planned for next month, following with Dr. Rayann Heman Echo: EF 55-60%, no pfo, no thrombus  He had no questions. He is feeling well. Continues to take Plavix. He has cut back on smoking  I connected with Sean Weaver on 05/09/18 at  1:00 PM EDT by a video enabled telemedicine application and verified that I am speaking with the correct person using two identifiers.  Location: Patient: home Provider: office   I discussed the limitations of evaluation and management by telemedicine and the availability of in person appointments. The patient expressed understanding and agreed to proceed.  Follow Up Instructions:    I discussed the assessment and treatment plan with the patient. The patient was provided an opportunity to ask questions and all were answered. The patient agreed with the plan and demonstrated an understanding of the instructions.   The patient was advised to call back or seek an in-person evaluation if the symptoms worsen or if the condition fails to improve as anticipated.  I provided 25 minutes of non-face-to-face time during this encounter.    Melvenia Beam, MD    Interval history 02/19/2018: I reviewed MRI images with patient which showed a distal PCA embolic stroke likely happened on Deceber 6th of 2019 when he experienced monocular vision loss. Also reviewed with neuroradiology and Dr. Leonie Man, vascular stroke physician, who also agree  this appears embolic. Had a long discussion with patient on the plan. He will have better insurance in 6 weeks and we will proceed with a stroke workup at that time. See assessment and plan for orders.  HPI:  Sean Weaver is a 63 y.o. male here as requested by Dr. Renold Genta for . Behind the left eye. Dizziness started a year ago. He has  A feeling of motion when there is none. He had peripheral vision loss. He never notices at night. Usually at work. Feel very motion sensitive. No migraines. The episodes last 10 minutes - 15 minutes. It happens once a week. Sitting makes it better. Not lightheaded. No chest pain or palpitations. He doesn't drink a lot of water.  Lost his vision like a black line across his eye and couldn;t see. Left peripheral vision. Happened 6 weeks ago and hasnt had that since., on the edge of the periphery. Usually he is standing. He has very poor insurance. MRI brain and MRA of the head. No other focal neurologic deficits, associated symptoms, inciting events or modifiable factors.   Review of Systems: Patient complains of symptoms per HPI as well as the following symptoms: vertigo, loss of vision. Pertinent negatives and positives per HPI. All others negative.   Social History   Socioeconomic History  . Marital status: Single    Spouse name: Not on file  . Number of children: 0  . Years of education: Not on file  . Highest education level: Some college, no degree  Occupational History  . Not on file  Social Needs  . Financial resource strain:  Not on file  . Food insecurity:    Worry: Not on file    Inability: Not on file  . Transportation needs:    Medical: Not on file    Non-medical: Not on file  Tobacco Use  . Smoking status: Current Every Day Smoker    Types: Cigarettes  . Smokeless tobacco: Never Used  . Tobacco comment: 14 cigarettes per day  Substance and Sexual Activity  . Alcohol use: Yes    Alcohol/week: 14.0 standard drinks    Types: 14 Standard  drinks or equivalent per week  . Drug use: Yes    Frequency: 4.0 times per week    Types: Marijuana    Comment: "light usage"  . Sexual activity: Not on file  Lifestyle  . Physical activity:    Days per week: Not on file    Minutes per session: Not on file  . Stress: Not on file  Relationships  . Social connections:    Talks on phone: Not on file    Gets together: Not on file    Attends religious service: Not on file    Active member of club or organization: Not on file    Attends meetings of clubs or organizations: Not on file    Relationship status: Not on file  . Intimate partner violence:    Fear of current or ex partner: Not on file    Emotionally abused: Not on file    Physically abused: Not on file    Forced sexual activity: Not on file  Other Topics Concern  . Not on file  Social History Narrative   Lives at home alone  In [place].   Right handed   Caffeine: 3-4 cups per day   Hair dressor Chief Executive Officer)    Family History  Problem Relation Age of Onset  . Cancer Mother        ovarian  . Cancer Father        prostate   . Kidney failure Father   . Hypertension Sister   . Non-Hodgkin's lymphoma Sister   . Migraines Neg Hx   . Headache Neg Hx     Past Medical History:  Diagnosis Date  . GERD (gastroesophageal reflux disease)    resolved per pt report  . Hyperlipidemia   . Hypertension   . Hypothyroidism   . Psoriatic arthritis ([place])   . Stroke (cerebrum) ([place])   . Tubular adenoma     Past Surgical History:  Procedure Laterality Date  . dental procedure     removed tooth, will receive implant     Current Outpatient Medications  Medication Sig Dispense Refill  . atorvastatin (LIPITOR) 40 MG tablet Take 1 tablet (40 mg total) by mouth daily. 90 tablet 1  . buPROPion (WELLBUTRIN XL) 150 MG 24 hr tablet Take 1 tablet (150 mg total) by mouth daily. 90 tablet 1  . cetirizine (ZYRTEC) 10 MG tablet Take 10 mg by mouth daily.    . clopidogrel (PLAVIX)  75 MG tablet Take 1 tablet (75 mg total) by mouth daily. 30 tablet 11  . ramipril (ALTACE) 2.5 MG capsule TAKE 1 CAPSULE BY MOUTH EVERY DAY 90 capsule 0  . sildenafil (REVATIO) 20 MG tablet TAKE UP TO 5 TABLETS DAILY AS NEEDED 50 tablet prn  . SYNTHROID 100 MCG tablet Take 1 tablet (100 mcg total) by mouth daily. 90 tablet 1   No current facility-administered medications for this visit.     Allergies as of  05/09/2018 - Review Complete 05/05/2018  Allergen Reaction Noted  . Aspirin Swelling 08/15/2010    Vitals: There were no vitals taken for this visit. Last Weight:  Wt Readings from Last 1 Encounters:  05/05/18 134 lb (60.8 kg)   Last Height:   Ht Readings from Last 1 Encounters:  05/05/18 5\' 6"  (1.676 m)   Physical exam: Exam: Gen: NAD, thin, cigarette smoker             CV: RRR, no MRG. No Carotid Bruits. No peripheral edema, warm, nontender Eyes: Conjunctivae clear without exudates or hemorrhage  Neuro: Detailed Neurologic Exam  Speech:    Speech is normal; fluent and spontaneous with normal comprehension.  Cognition:    The patient is oriented to person, place, and time;     recent and remote memory intact;     language fluent;     normal attention, concentration,     fund of knowledge Cranial Nerves:    The pupils are equal, round, and reactive to light. The fundi are normal and spontaneous venous pulsations are present. Visual fields are full to finger confrontation. Extraocular movements are intact. Trigeminal sensation is intact and the muscles of mastication are normal. The face is symmetric. The palate elevates in the midline. Hearing intact. Voice is normal. Shoulder shrug is normal. The tongue has normal motion without fasciculations.   Coordination:    Normal finger to nose and heel to shin. Normal rapid alternating movements.   Gait:    Heel-toe and tandem gait are normal.   Motor Observation:    No asymmetry, no atrophy, and no involuntary movements  noted. Tone:    Normal muscle tone.    Posture:    Posture is normal. normal erect    Strength:    Strength is V/V in the upper and lower limbs.      Sensation: intact to LT     Reflex Exam:  DTR's:    Deep tendon reflexes in the upper and lower extremities are normal bilaterally.   Toes:    The toes are downgoing bilaterally.   Clonus:    Clonus is absent.       Assessment/Plan: This is a lovely 63 year old patient here for episodic vertigo with transient monocular vision loss.  Patient does have significant risk factors for stroke including chronic tobacco use, HTN.    MRI images with patient which showed a distal PCA embolic stroke. Also reviewed with neuroradiology and Dr. Leonie Man, vascular stroke physician, who also agree this appears embolic. Right occipital ischemic infarct would fit his symptoms that occurred several months prior; I suspect this stroke is from December 10 2017 when he had his monocular vision loss.   - Ldl: 102. Could not tolerate increase in statin. Goal is < 70, we will follow as he changes his diet and exercise routines. Saw dietician. HgbA1c: 5.8, controlled, saw dietician. Follow with Tommie Ard Baxley Carotid Dopplers: 1-39% bilaterally CTA head and neck: Pending Loop: Planned for next month, following with Dr. Rayann Heman Echo: EF 55-60%, no pfo, no thrombus  He had no questions. He is feeling well. Continues to take Plavix. He has cut back on smoking  I had a long d/w patient about his recent stroke, risk for recurrent stroke/TIAs, personally independently reviewed imaging studies and stroke evaluation results and answered questions.Continue Plavix for secondary stroke prevention and maintain strict control of hypertension with blood pressure goal below 130/90, diabetes with hemoglobin A1c goal below 6.5% and lipids with LDL  cholesterol goal below 70 mg/dL. I also advised the patient to eat a healthy diet with plenty of whole grains, cereals, fruits and  vegetables, exercise regularly and maintain ideal body weight and to stop smoking.   Sarina Ill, MD  [facility] [address] [place], [place] [address]  Phone [phone] Fax [phone]  A total of 40 minutes was spent face-to-face with this patient. Over half this time was spent on counseling patient on the  1. 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

## 2018-05-17 ENCOUNTER — Ambulatory Visit: Payer: BLUE CROSS/BLUE SHIELD | Admitting: Neurology

## 2018-05-23 ENCOUNTER — Ambulatory Visit
Admission: RE | Admit: 2018-05-23 | Discharge: 2018-05-23 | Disposition: A | Discharge status: Home / Self Care | Payer: Medicare Other | Source: Ambulatory Visit | Attending: Neurology | Admitting: Neurology

## 2018-05-23 ENCOUNTER — Encounter: Payer: Self-pay | Admitting: Radiology

## 2018-05-23 ENCOUNTER — Telehealth: Payer: Self-pay | Admitting: Neurology

## 2018-05-23 ENCOUNTER — Other Ambulatory Visit: Payer: Self-pay

## 2018-05-23 DIAGNOSIS — I63431 Cerebral infarction due to embolism of right posterior cerebral artery: Secondary | ICD-10-CM

## 2018-05-23 MED ORDER — IOPAMIDOL (ISOVUE-370) INJECTION 76%
75.0000 mL | Freq: Once | INTRAVENOUS | Status: AC | PRN
  Administered 2018-05-23: 75 mL via INTRAVENOUS

## 2018-05-23 NOTE — Telephone Encounter (Signed)
I called and left a message for patient. His blood vessels did not show any significant narrowing or atherosclerotic disease. But the CT report mentions emphysema and atherosclerosis in the aorta. 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 But I would follow up with Dr. Renold Genta on these other findings.    Dr. Renold Genta, fyi. thanks

## 2018-05-24 NOTE — Telephone Encounter (Signed)
Do you think he needs ENT consult?

## 2018-05-24 NOTE — Telephone Encounter (Signed)
I don't think it would hurt considering he has been a smoker and I worry about throat or mouth cancer. If ENT thiks it is a benign cyst then at least we know. What do you think?

## 2018-05-24 NOTE — Telephone Encounter (Signed)
OK will arrange

## 2018-05-29 ENCOUNTER — Other Ambulatory Visit: Payer: Self-pay | Admitting: Internal Medicine

## 2018-05-30 ENCOUNTER — Encounter: Payer: Self-pay | Admitting: Internal Medicine

## 2018-05-30 ENCOUNTER — Telehealth: Payer: Self-pay | Admitting: Internal Medicine

## 2018-05-30 NOTE — Telephone Encounter (Signed)
Left message for pt regarding a finding on recent MR of neck, a vallecular cyst. Dr. Jaynee Eagles called this to my attention. I asked Dr. Jaynee Eagles if she thinks ENT consult is needed and she does think it may be a good idea. Can arrange at patient's convenience.

## 2018-05-31 NOTE — Telephone Encounter (Signed)
Called and spoke with Sean Weaver, scheduled him to see Dr Robbi Garter on 0/45/4098 @1 :00pm

## 2018-06-06 ENCOUNTER — Telehealth: Payer: Self-pay

## 2018-06-06 NOTE — Telephone Encounter (Signed)
Left detailed message  Advised loop recorder implant could be June 10  Advised Pt to return call.  Also sent MyChart message.

## 2018-06-14 ENCOUNTER — Ambulatory Visit (INDEPENDENT_AMBULATORY_CARE_PROVIDER_SITE_OTHER): Payer: Medicare Other | Admitting: Internal Medicine

## 2018-06-14 ENCOUNTER — Telehealth: Payer: Self-pay | Admitting: Internal Medicine

## 2018-06-14 DIAGNOSIS — M7989 Other specified soft tissue disorders: Secondary | ICD-10-CM

## 2018-06-14 DIAGNOSIS — Z872 Personal history of diseases of the skin and subcutaneous tissue: Secondary | ICD-10-CM

## 2018-06-14 MED ORDER — DOXYCYCLINE HYCLATE 100 MG PO TABS: 100.0000 mg | ORAL_TABLET | Freq: Two times a day (BID) | ORAL | 0 refills | Status: DC

## 2018-06-14 MED ORDER — FLUOCINONIDE 0.05 % EX OINT: 1.0000 "application " | TOPICAL_OINTMENT | Freq: Two times a day (BID) | CUTANEOUS | 0 refills | Status: DC

## 2018-06-14 NOTE — Telephone Encounter (Signed)
Khalen Styer [phone]  Herbie [place] called to say that he is having some selling in both of his index fingers the left one is worse than the right. Is okay with virtual visit, I suggested 3:45 today and he said that would be great with work.

## 2018-06-14 NOTE — Progress Notes (Signed)
   Subjective:    Patient ID: Sean Weaver, male    DOB: 02-Dec-1955, 63 y.o.   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  Recent issue with right index finger being swollen.  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  It is painful.  He is seen today by [employer] due to coronavirus pandemic.  He is identified using 2 identifiers as Sean Weaver, a longstanding patient in this practice.  He is agreeable to visit in this format today.  He thinks this is psoriatic arthritis.  He has been reading about it on the Internet.  He would like to have rheumatology consultation.    Review of Systems see above no fever or chills     Objective:   Physical Exam  Right index finger appears to be swollen between the PIP and tip of the index finger.  The joints appear to be swollen and a bit violaceous.  Creased range of motion in both joints.      Assessment & Plan:  Acute inflammatory arthritis right index finger  History of psoriasis  Plan: Rheumatology referral for further evaluation.  Try Lidex 0.05% ointment twice daily to index finger.

## 2018-06-14 NOTE — Telephone Encounter (Signed)
OK 

## 2018-06-14 NOTE — Telephone Encounter (Signed)
Virtual visit scheduled.  

## 2018-06-20 DIAGNOSIS — J387 Other diseases of larynx: Secondary | ICD-10-CM | POA: Diagnosis not present

## 2018-06-28 NOTE — Telephone Encounter (Signed)
Follow up   Patient would like to know he can have a loop recorder implant put in. Please advise.

## 2018-07-02 ENCOUNTER — Encounter: Payer: Self-pay | Admitting: Internal Medicine

## 2018-07-02 NOTE — Patient Instructions (Addendum)
Referral to rheumatology.  Arrangements will be made.  Apply Lidex ointment twice daily as prescribed

## 2018-07-04 NOTE — Telephone Encounter (Signed)
New message   Patient is calling to set up a time to loop implant. Please call.

## 2018-07-06 ENCOUNTER — Ambulatory Visit: Payer: Medicare Other | Admitting: Internal Medicine

## 2018-07-07 DIAGNOSIS — L4050 Arthropathic psoriasis, unspecified: Secondary | ICD-10-CM | POA: Diagnosis not present

## 2018-07-07 DIAGNOSIS — L409 Psoriasis, unspecified: Secondary | ICD-10-CM | POA: Diagnosis not present

## 2018-07-07 DIAGNOSIS — M2550 Pain in unspecified joint: Secondary | ICD-10-CM | POA: Diagnosis not present

## 2018-07-14 ENCOUNTER — Encounter: Payer: Self-pay | Admitting: Internal Medicine

## 2018-07-15 ENCOUNTER — Other Ambulatory Visit: Payer: Self-pay

## 2018-07-15 ENCOUNTER — Encounter: Payer: Self-pay | Admitting: Internal Medicine

## 2018-07-15 ENCOUNTER — Ambulatory Visit (INDEPENDENT_AMBULATORY_CARE_PROVIDER_SITE_OTHER): Payer: Medicare Other | Admitting: Internal Medicine

## 2018-07-15 VITALS — BP 142/68 | HR 58 | Ht 66.0 in | Wt 135.0 lb

## 2018-07-15 DIAGNOSIS — I10 Essential (primary) hypertension: Secondary | ICD-10-CM | POA: Diagnosis not present

## 2018-07-15 DIAGNOSIS — I63431 Cerebral infarction due to embolism of right posterior cerebral artery: Secondary | ICD-10-CM | POA: Diagnosis not present

## 2018-07-15 HISTORY — PX: OTHER SURGICAL HISTORY: SHX169

## 2018-07-15 NOTE — Progress Notes (Signed)
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  Since last being seen in our clinic, the patient reports doing very well.  Today, he denies symptoms of palpitations, chest pain, shortness of breath,  lower extremity edema, dizziness, presyncope, or syncope.  The patient is otherwise without complaint today.   Past Medical History:  Diagnosis Date  . GERD (gastroesophageal reflux disease)    resolved per pt report  . Hyperlipidemia   . Hypertension   . Hypothyroidism   . Psoriatic arthritis ([place])   . Stroke (cerebrum) ([place])   . Tubular adenoma    Past Surgical History:  Procedure Laterality Date  . dental procedure     removed tooth, will receive implant     ROS- all systems are reviewed and negatives except as per HPI above  Current Outpatient Medications  Medication Sig Dispense Refill  . atorvastatin (LIPITOR) 40 MG tablet Take 1 tablet (40 mg total) by mouth daily. 90 tablet 1  . buPROPion (WELLBUTRIN XL) 150 MG 24 hr tablet Take 1 tablet (150 mg total) by mouth daily. 90 tablet 1  . cetirizine (ZYRTEC) 10 MG tablet Take 10 mg by mouth daily.    . clopidogrel (PLAVIX) 75 MG tablet Take 1 tablet (75 mg total) by mouth daily. 30 tablet 11  . doxycycline (VIBRA-TABS) 100 MG tablet Take 1 tablet (100 mg total) by mouth 2 (two) times daily. 20 tablet 0  . fluocinonide ointment (LIDEX) 7.94 % Apply 1 application topically 2 (two) times daily. 30 g 0  . ramipril (ALTACE) 2.5 MG capsule TAKE 1 CAPSULE BY MOUTH  DAILY 90 capsule 3  . sildenafil (REVATIO) 20 MG tablet TAKE UP TO 5 TABLETS DAILY AS NEEDED 50 tablet prn  . SYNTHROID 100 MCG tablet Take 1 tablet (100 mcg total) by mouth daily. 90 tablet 1   No current facility-administered medications for this visit.     Physical Exam: Vitals:   07/15/18 0847  BP: (!) 142/68  Pulse: (!) 58  Weight: 135 lb (61.2 kg)  Height: 5\' 6"   (1.676 m)    GEN- The patient is well appearing, alert and oriented x 3 today.   Head- normocephalic, atraumatic Eyes-  Sclera clear, conjunctiva pink Ears- hearing intact Oropharynx- clear Lungs- Clear to ausculation bilaterally, normal work of breathing Heart- Regular rate and rhythm, no murmurs, rubs or gallops, PMI not laterally displaced GI- soft, NT, ND, + BS Extremities- no clubbing, cyanosis, or edema  Wt Readings from Last 3 Encounters:  07/15/18 135 lb (61.2 kg)  05/05/18 134 lb (60.8 kg)  01/24/18 140 lb (63.5 kg)    Assessment and Plan:  1. Cryptogenic stroke Previous monitoring has not revealed source of stroke. I would advise long term monitoring with an implanted loop recorder to evaluate for afib as the cause.  Risks and benefits to this procedure discussed with the patient who wishes to proceed.  2. HTN Stable No change required today  3. Tobacco Cessation advised   Thompson Grayer MD, [facility] 07/15/2018 9:14 AM       ILR implant:  Informed written consent was obtained.  The patient required no sedation for the procedure today.  The patients left chest was prepped and draped. Mapping over the patient's chest was performed to identify the appropriate ILR site.  This area was found to be the left parasternal region over the 3rd-4th  intercostal space.  The skin overlying this region was infiltrated with lidocaine for local analgesia.  A 0.5-cm incision was made at the implant site.  A subcutaneous ILR pocket was fashioned using a combination of sharp and blunt dissection.  A Medtronic Reveal Linq model [id number] implantable loop recorder (SN [id number]) was then placed into the pocket R waves were very prominent and measured > 0.2 mV. EBL<1 ml.  Steri- Strips and a sterile dressing were then applied.  There were no early apparent complications.     CONCLUSIONS:   1. Successful implantation of a Medtronic Reveal LINQ implantable loop recorder for cryptogenic stroke  2. No  early apparent complications.   Thompson Grayer MD, [facility] 07/15/2018 9:15 AM

## 2018-07-15 NOTE — Patient Instructions (Addendum)
You may remove the large outer dressing tomorrow If you have oozing from your device site, apply firm pressure for 10 minutes and it should stop. If continued oozing occurs, keep applying pressure.   Do NOT go to the ER for oozing from your device site  We will do a virtual wound check appointment on 07/28/18 at 3:30PM. Please send a picture of your device site through the MyChart patient portal anytime that day.   Call the device clinic at [phone] for any issues or concerns.

## 2018-07-28 ENCOUNTER — Ambulatory Visit (INDEPENDENT_AMBULATORY_CARE_PROVIDER_SITE_OTHER): Payer: Medicare Other | Admitting: *Deleted

## 2018-07-28 ENCOUNTER — Other Ambulatory Visit: Payer: Self-pay

## 2018-07-28 DIAGNOSIS — I63431 Cerebral infarction due to embolism of right posterior cerebral artery: Secondary | ICD-10-CM

## 2018-07-28 LAB — CUP PACEART INCLINIC DEVICE CHECK
Date Time Interrogation Session: 20200723155219
Implantable Pulse Generator Implant Date: 20200710

## 2018-07-28 NOTE — Progress Notes (Signed)
Virtual ILR wound check appointment. Pt states he removed steri strips, denies redness, swelling, and drainage at the site. Home monitor transmitting nightly. No episodes. Questions answered.

## 2018-08-08 DIAGNOSIS — L4050 Arthropathic psoriasis, unspecified: Secondary | ICD-10-CM | POA: Diagnosis not present

## 2018-08-08 DIAGNOSIS — M150 Primary generalized (osteo)arthritis: Secondary | ICD-10-CM | POA: Diagnosis not present

## 2018-08-08 DIAGNOSIS — L409 Psoriasis, unspecified: Secondary | ICD-10-CM | POA: Diagnosis not present

## 2018-08-17 ENCOUNTER — Ambulatory Visit (INDEPENDENT_AMBULATORY_CARE_PROVIDER_SITE_OTHER): Payer: Medicare Other | Admitting: *Deleted

## 2018-08-17 DIAGNOSIS — I63431 Cerebral infarction due to embolism of right posterior cerebral artery: Secondary | ICD-10-CM | POA: Diagnosis not present

## 2018-08-17 LAB — CUP PACEART REMOTE DEVICE CHECK
Date Time Interrogation Session: 20200812113501
Implantable Pulse Generator Implant Date: 20200710

## 2018-08-22 ENCOUNTER — Other Ambulatory Visit: Payer: Self-pay | Admitting: Internal Medicine

## 2018-08-25 NOTE — Progress Notes (Signed)
Carelink Summary Report / Loop Recorder 

## 2018-08-29 ENCOUNTER — Ambulatory Visit: Payer: Medicare Other | Admitting: Internal Medicine

## 2018-09-05 DIAGNOSIS — H2513 Age-related nuclear cataract, bilateral: Secondary | ICD-10-CM | POA: Diagnosis not present

## 2018-09-05 DIAGNOSIS — H01001 Unspecified blepharitis right upper eyelid: Secondary | ICD-10-CM | POA: Diagnosis not present

## 2018-09-05 DIAGNOSIS — H01002 Unspecified blepharitis right lower eyelid: Secondary | ICD-10-CM | POA: Diagnosis not present

## 2018-09-05 DIAGNOSIS — R51 Headache: Secondary | ICD-10-CM | POA: Diagnosis not present

## 2018-09-19 ENCOUNTER — Ambulatory Visit (INDEPENDENT_AMBULATORY_CARE_PROVIDER_SITE_OTHER): Payer: Medicare Other | Admitting: *Deleted

## 2018-09-19 DIAGNOSIS — I63431 Cerebral infarction due to embolism of right posterior cerebral artery: Secondary | ICD-10-CM

## 2018-09-19 LAB — CUP PACEART REMOTE DEVICE CHECK
Date Time Interrogation Session: 20200914122314
Implantable Pulse Generator Implant Date: 20200710

## 2018-09-30 NOTE — Progress Notes (Signed)
Carelink Summary Report / Loop Recorder 

## 2018-10-04 ENCOUNTER — Other Ambulatory Visit: Payer: Self-pay

## 2018-10-04 MED ORDER — SILDENAFIL CITRATE 20 MG PO TABS: ORAL_TABLET | ORAL | 99 refills | Status: DC

## 2018-10-23 LAB — CUP PACEART REMOTE DEVICE CHECK
Date Time Interrogation Session: 20201017141055
Implantable Pulse Generator Implant Date: 20200710

## 2018-10-24 ENCOUNTER — Ambulatory Visit (INDEPENDENT_AMBULATORY_CARE_PROVIDER_SITE_OTHER): Payer: Medicare Other | Admitting: *Deleted

## 2018-10-24 DIAGNOSIS — I63431 Cerebral infarction due to embolism of right posterior cerebral artery: Secondary | ICD-10-CM

## 2018-10-26 ENCOUNTER — Telehealth: Payer: Self-pay

## 2018-10-26 NOTE — Telephone Encounter (Signed)
LMOVM for pt to stop sending manual transmissions with home remote monitor. I left my direct office number in case the pt has questions.

## 2018-11-02 ENCOUNTER — Telehealth: Payer: Self-pay | Admitting: Internal Medicine

## 2018-11-02 NOTE — Telephone Encounter (Signed)
Patient called back and schedule CPE and Labs

## 2018-11-02 NOTE — Telephone Encounter (Signed)
LVM to CB and schedule CPE and labs past due 06/15/18

## 2018-11-07 DIAGNOSIS — Z79899 Other long term (current) drug therapy: Secondary | ICD-10-CM | POA: Diagnosis not present

## 2018-11-07 DIAGNOSIS — L409 Psoriasis, unspecified: Secondary | ICD-10-CM | POA: Diagnosis not present

## 2018-11-07 DIAGNOSIS — L4050 Arthropathic psoriasis, unspecified: Secondary | ICD-10-CM | POA: Diagnosis not present

## 2018-11-07 DIAGNOSIS — M2550 Pain in unspecified joint: Secondary | ICD-10-CM | POA: Diagnosis not present

## 2018-11-07 DIAGNOSIS — M150 Primary generalized (osteo)arthritis: Secondary | ICD-10-CM | POA: Diagnosis not present

## 2018-11-11 NOTE — Progress Notes (Signed)
Carelink Summary Report / Loop Recorder 

## 2018-11-21 ENCOUNTER — Ambulatory Visit (INDEPENDENT_AMBULATORY_CARE_PROVIDER_SITE_OTHER): Payer: Medicare Other | Admitting: Internal Medicine

## 2018-11-21 ENCOUNTER — Other Ambulatory Visit: Payer: Self-pay

## 2018-11-21 ENCOUNTER — Encounter: Payer: Self-pay | Admitting: Internal Medicine

## 2018-11-21 VITALS — Ht 65.0 in | Wt 135.0 lb

## 2018-11-21 DIAGNOSIS — Z0000 Encounter for general adult medical examination without abnormal findings: Secondary | ICD-10-CM

## 2018-11-21 NOTE — Progress Notes (Signed)
Subjective:    Patient ID: Sean Weaver, male    DOB: [birth year], 63 y.o.   MRN: [id number]  HPI 63 year old Male seen by interactive audio and video telecommunications due to the coronavirus pandemic.  He is agreeable to visit in this format today.  He is identified using 2 identifiers as Sean Weaver. [name],  a longstanding patient in this practice.  He is here for Annual Medicare wellness visit.  He is now 63 years of age as of April of this year.  He has a history of hypothyroidism, psoriasis, essential hypertension, hyperlipidemia.  History of cluster headaches but no recent episodes.  He has seen Dr. Jaynee Eagles for evaluation and is also followed by Dr. Rayann Heman, cardiologist.  He has a loop recorder implanted.  Patient smokes about 15 cigarettes daily and is smoked for many years.  Family history: Father with history of prostate cancer and was on dialysis but withdrew from dialysis and passed away.  Mother died in [id number] with complications of ovarian cancer.  1 brother in good health.  1 sister with history of hypothyroidism and hypertension.  Social history: Social alcohol consumption.  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 location for [employer].  He is currently seeing Dr. Lenna Gilford for psoriasis and psoriatic arthritis.  He is on Humira without complications.  He has had a flu vaccine and Prevnar 13 vaccine August 30, 2018.  He is due for colonoscopy.  He was reminded to call for an appointment.  Last colonoscopy was 2007 by Dr. Olevia Perches and adenomatous polyps were noted.    Review of Systems no new complaints  Subjective:   Patient presents for Medicare Annual/Subsequent preventive examination.  Review Past Medical/Family/Social: See above   Risk Factors  Current exercise habits: Walks some Dietary issues discussed: Low-fat low carbohydrate recommended  Cardiac risk factors: Smoking, hyperlipidemia   Depression Screen  (Note: if answer to either of the following is "Yes", a more complete depression screening is indicated)   Over the past two weeks, have you felt down, depressed or hopeless? No  Over the past two weeks, have you felt little interest or pleasure in doing things? No Have you lost interest or pleasure in daily life? No Do you often feel hopeless? No Do you cry easily over simple problems? No   Activities of Daily Living  In your present state of health, do you have any difficulty performing the following activities?:   Driving? No  Managing money? No  Feeding yourself? No  Getting from bed to chair? No  Climbing a flight of stairs? No  Preparing food and eating?: No  Bathing or showering? No  Getting dressed: No  Getting to the toilet? No  Using the toilet:No  Moving around from place to place: No  In the past year have you fallen or had a near fall?:No  Are you sexually active? yes Do you have more than one partner? No   Hearing Difficulties: No  Do you often ask people to speak up or repeat themselves? No  Do you experience ringing or noises in your ears? No  Do you have difficulty understanding soft or whispered voices? No  Do you feel that you have a problem with memory? No Do you often misplace items? No    Home Safety:  Do you have a smoke alarm at your residence? Yes Do you have grab bars in the bathroom?  None Do you have throw rugs in your house?  None   Cognitive Testing  Alert? Yes Normal Appearance?Yes  Oriented to person? Yes Place? Yes  Time? Yes  Recall of three objects? Yes  Can perform simple calculations? Yes  Displays appropriate judgment?Yes  Can read the correct time from a watch face?Yes   List the Names of Other Physician/Practitioners you currently use:  See referral list for the physicians patient is currently seeing  Dr. Jaynee Eagles Dr. Rayann Heman Rheumatologist  Review of Systems: See above   Objective:     General  appearance: Appears younger than stated age.  Blood pressure 149/82 at home but has been cleaning house today. Psych: Alert & Oriented x 3, Mood appear stable.    Assessment:    Annual wellness medicare exam   Plan:    During the course of the visit the patient was educated and counseled about appropriate screening and preventive services including:   Colonoscopy needed.     Patient Instructions (the written plan) was given to the patient.  Medicare Attestation  I have personally reviewed:  The patient's medical and social history  Their use of alcohol, tobacco or illicit drugs  Their current medications and supplements  The patient's functional ability including ADLs,fall risks, home safety risks, cognitive, and hearing and visual impairment  Diet and physical activities  Evidence for depression or mood disorders  The patient's weight, height, BMI, and visual acuity have been recorded in the chart. I have made referrals, counseling, and provided education to the patient based on review of the above and I have provided the patient with a written personalized care plan for preventive services.            Objective:   Physical Exam He says his blood pressure is stable but a little elevated at 149/82.  He has been cleaning house today.  He will keep an eye on his blood pressure.  Generally it is very good.  He is walking for exercise.       Assessment & Plan:  Annual Medicare wellness visit completed  Essential hypertension-we will continue to monitor blood pressure at home and let me know if persistently elevated  Hyperlipidemia-has physical examination scheduled with fasting labs early February.  Continue Lipitor  Psoriasis and psoriatic arthritis treated with Humira by rheumatologist  History of stroke and has loop recorder implanted and followed by Dr. Jaynee Eagles and Dr. Rayann Heman.  Currently on Plavix.  This likely occurred December 2019 when he experienced monocular  vision loss.  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  Because this was thought to be embolic he was sent to cardiology for evaluation.  Carotid Doppler showed 1 to 39% stenosis bilaterally.  Risk factors for stroke include tobacco use and hypertension.  He does have hyperlipidemia.  Health maintenance-immunizations are up-to-date.  Needs to have colonoscopy.  Hypothyroidism-continue Synthroid 100 mcg daily  Plan: Follow-up with annual physical examination and fasting lab work February 2021.  Continue to monitor blood pressure at home.

## 2018-11-21 NOTE — Patient Instructions (Signed)
It was a pleasure to see you for first welcome to Medicare annual wellness visit.  Please keep an eye on blood pressure and let me know if persistently elevated.  We will follow-up in February 2021 for in person physical exam and fasting labs.  Your immunizations are up-to-date.  I would recommend a colonoscopy in the near future.

## 2018-11-24 ENCOUNTER — Ambulatory Visit (INDEPENDENT_AMBULATORY_CARE_PROVIDER_SITE_OTHER): Payer: Medicare Other | Admitting: *Deleted

## 2018-11-24 DIAGNOSIS — I63431 Cerebral infarction due to embolism of right posterior cerebral artery: Secondary | ICD-10-CM

## 2018-11-24 LAB — CUP PACEART REMOTE DEVICE CHECK
Date Time Interrogation Session: 20201119131042
Implantable Pulse Generator Implant Date: 20200710

## 2018-12-23 ENCOUNTER — Other Ambulatory Visit: Payer: Self-pay | Admitting: Neurology

## 2018-12-23 DIAGNOSIS — I639 Cerebral infarction, unspecified: Secondary | ICD-10-CM

## 2018-12-23 NOTE — Progress Notes (Signed)
Carelink Summary Report / Loop Recorder 

## 2018-12-27 ENCOUNTER — Ambulatory Visit (INDEPENDENT_AMBULATORY_CARE_PROVIDER_SITE_OTHER): Payer: Medicare Other | Admitting: *Deleted

## 2018-12-27 DIAGNOSIS — I63431 Cerebral infarction due to embolism of right posterior cerebral artery: Secondary | ICD-10-CM | POA: Diagnosis not present

## 2018-12-27 LAB — CUP PACEART REMOTE DEVICE CHECK
Date Time Interrogation Session: 20201222130836
Implantable Pulse Generator Implant Date: 20200710

## 2019-01-30 ENCOUNTER — Ambulatory Visit (INDEPENDENT_AMBULATORY_CARE_PROVIDER_SITE_OTHER): Payer: Medicare Other | Admitting: *Deleted

## 2019-01-30 DIAGNOSIS — I63431 Cerebral infarction due to embolism of right posterior cerebral artery: Secondary | ICD-10-CM

## 2019-01-30 LAB — CUP PACEART REMOTE DEVICE CHECK
Date Time Interrogation Session: 20210124235857
Implantable Pulse Generator Implant Date: 20200710

## 2019-01-31 DIAGNOSIS — Z03818 Encounter for observation for suspected exposure to other biological agents ruled out: Secondary | ICD-10-CM | POA: Diagnosis not present

## 2019-02-02 ENCOUNTER — Ambulatory Visit: Payer: Medicare Other

## 2019-02-11 ENCOUNTER — Ambulatory Visit: Payer: Medicare Other | Attending: Internal Medicine

## 2019-02-11 DIAGNOSIS — Z23 Encounter for immunization: Secondary | ICD-10-CM | POA: Insufficient documentation

## 2019-02-11 NOTE — Progress Notes (Signed)
   Covid-19 Vaccination Clinic  Name:  Sean Weaver    MRN: [id number] DOB: May 18, 1955  02/11/2019  [name] was observed post Covid-19 immunization for 15 minutes without incidence. He was provided with Vaccine Information Sheet and instruction to access the V-Safe system.   [name] was instructed to call 911 with any severe reactions post vaccine: Marland Kitchen Difficulty breathing  . Swelling of your face and throat  . A fast heartbeat  . A bad rash all over your body  . Dizziness and weakness    Immunizations Administered    Name Date Dose VIS Date Route   Pfizer COVID-19 Vaccine 02/11/2019  8:39 AM 0.3 mL 12/16/2018 Intramuscular   Manufacturer: [place]   Lot: [id number]   [place]: [id number]

## 2019-02-13 ENCOUNTER — Ambulatory Visit: Payer: Medicare Other

## 2019-02-13 ENCOUNTER — Other Ambulatory Visit: Payer: Self-pay

## 2019-02-13 ENCOUNTER — Other Ambulatory Visit: Payer: Medicare Other | Admitting: Internal Medicine

## 2019-02-13 DIAGNOSIS — E7800 Pure hypercholesterolemia, unspecified: Secondary | ICD-10-CM | POA: Diagnosis not present

## 2019-02-13 DIAGNOSIS — R7302 Impaired glucose tolerance (oral): Secondary | ICD-10-CM | POA: Diagnosis not present

## 2019-02-13 DIAGNOSIS — Z0000 Encounter for general adult medical examination without abnormal findings: Secondary | ICD-10-CM

## 2019-02-13 DIAGNOSIS — E039 Hypothyroidism, unspecified: Secondary | ICD-10-CM | POA: Diagnosis not present

## 2019-02-13 DIAGNOSIS — Z125 Encounter for screening for malignant neoplasm of prostate: Secondary | ICD-10-CM

## 2019-02-13 DIAGNOSIS — I63431 Cerebral infarction due to embolism of right posterior cerebral artery: Secondary | ICD-10-CM

## 2019-02-13 DIAGNOSIS — Z87891 Personal history of nicotine dependence: Secondary | ICD-10-CM

## 2019-02-14 LAB — COMPLETE METABOLIC PANEL WITH GFR
AG Ratio: 2.1 (calc) (ref 1.0–2.5)
ALT: 17 U/L (ref 9–46)
AST: 17 U/L (ref 10–35)
Albumin: 4.4 g/dL (ref 3.6–5.1)
Alkaline phosphatase (APISO): 71 U/L (ref 35–144)
BUN: 19 mg/dL (ref 7–25)
CO2: 28 mmol/L (ref 20–32)
Calcium: 9 mg/dL (ref 8.6–10.3)
Chloride: 101 mmol/L (ref 98–110)
Creat: 1.1 mg/dL (ref 0.70–1.25)
GFR, Est African American: 81 mL/min/{1.73_m2} (ref >=60)
GFR, Est Non African American: 70 mL/min/{1.73_m2} (ref >=60)
Globulin: 2.1 g/dL (calc) (ref 1.9–3.7)
Glucose, Bld: 90 mg/dL (ref 65–99)
Potassium: 4.7 mmol/L (ref 3.5–5.3)
Sodium: 138 mmol/L (ref 135–146)
Total Bilirubin: 1 mg/dL (ref 0.2–1.2)
Total Protein: 6.5 g/dL (ref 6.1–8.1)

## 2019-02-14 LAB — LIPID PANEL
Cholesterol: 191 mg/dL (ref <200)
HDL: 76 mg/dL (ref >=40)
LDL Cholesterol (Calc): 101 mg/dL (calc) — ABNORMAL HIGH
Non-HDL Cholesterol (Calc): 115 mg/dL (calc) (ref <130)
Total CHOL/HDL Ratio: 2.5 (calc) (ref <5.0)
Triglycerides: 59 mg/dL (ref <150)

## 2019-02-14 LAB — CBC WITH DIFFERENTIAL/PLATELET
Absolute Monocytes: 773 cells/uL (ref 200–950)
Basophils Absolute: 71 cells/uL (ref 0–200)
Basophils Relative: 1.2 %
Eosinophils Absolute: 100 cells/uL (ref 15–500)
Eosinophils Relative: 1.7 %
HCT: 42.8 % (ref 38.5–50.0)
Hemoglobin: 14.8 g/dL (ref 13.2–17.1)
Lymphs Abs: 1658 cells/uL (ref 850–3900)
MCH: 31.6 pg (ref 27.0–33.0)
MCHC: 34.6 g/dL (ref 32.0–36.0)
MCV: 91.3 fL (ref 80.0–100.0)
MPV: 10.2 fL (ref 7.5–12.5)
Monocytes Relative: 13.1 %
Neutro Abs: 3298 cells/uL (ref 1500–7800)
Neutrophils Relative %: 55.9 %
Platelets: 312 10*3/uL (ref 140–400)
RBC: 4.69 10*6/uL (ref 4.20–5.80)
RDW: 13.5 % (ref 11.0–15.0)
Total Lymphocyte: 28.1 %
WBC: 5.9 10*3/uL (ref 3.8–10.8)

## 2019-02-14 LAB — HEMOGLOBIN A1C
Hgb A1c MFr Bld: 5.6 % of total Hgb (ref <5.7)
Mean Plasma Glucose: 114 (calc)
eAG (mmol/L): 6.3 (calc)

## 2019-02-14 LAB — PSA: PSA: 1 ng/mL (ref <=4.0)

## 2019-02-14 LAB — TSH: TSH: 1.3 mIU/L (ref 0.40–4.50)

## 2019-02-19 ENCOUNTER — Other Ambulatory Visit: Payer: Self-pay | Admitting: Neurology

## 2019-02-19 DIAGNOSIS — I639 Cerebral infarction, unspecified: Secondary | ICD-10-CM

## 2019-02-19 MED ORDER — CLOPIDOGREL BISULFATE 75 MG PO TABS: 75.0000 mg | ORAL_TABLET | Freq: Every day | ORAL | 4 refills | Status: DC

## 2019-02-20 ENCOUNTER — Encounter: Payer: Self-pay | Admitting: Internal Medicine

## 2019-02-20 ENCOUNTER — Ambulatory Visit (INDEPENDENT_AMBULATORY_CARE_PROVIDER_SITE_OTHER): Payer: Medicare Other | Admitting: Internal Medicine

## 2019-02-20 ENCOUNTER — Other Ambulatory Visit: Payer: Self-pay

## 2019-02-20 VITALS — BP 140/80 | HR 123 | Temp 98.0°F | Ht 65.0 in | Wt 135.0 lb

## 2019-02-20 DIAGNOSIS — Z87891 Personal history of nicotine dependence: Secondary | ICD-10-CM | POA: Diagnosis not present

## 2019-02-20 DIAGNOSIS — E7800 Pure hypercholesterolemia, unspecified: Secondary | ICD-10-CM

## 2019-02-20 DIAGNOSIS — Z0000 Encounter for general adult medical examination without abnormal findings: Secondary | ICD-10-CM | POA: Diagnosis not present

## 2019-02-20 DIAGNOSIS — Z23 Encounter for immunization: Secondary | ICD-10-CM | POA: Diagnosis not present

## 2019-02-20 DIAGNOSIS — Z86010 Personal history of colonic polyps: Secondary | ICD-10-CM

## 2019-02-20 DIAGNOSIS — I10 Essential (primary) hypertension: Secondary | ICD-10-CM | POA: Diagnosis not present

## 2019-02-20 DIAGNOSIS — E039 Hypothyroidism, unspecified: Secondary | ICD-10-CM

## 2019-02-20 DIAGNOSIS — Z872 Personal history of diseases of the skin and subcutaneous tissue: Secondary | ICD-10-CM

## 2019-02-20 DIAGNOSIS — Z8673 Personal history of transient ischemic attack (TIA), and cerebral infarction without residual deficits: Secondary | ICD-10-CM

## 2019-02-20 LAB — POCT URINALYSIS DIPSTICK
Appearance: NEGATIVE
Bilirubin, UA: NEGATIVE
Blood, UA: NEGATIVE
Glucose, UA: NEGATIVE
Ketones, UA: NEGATIVE
Leukocytes, UA: NEGATIVE
Nitrite, UA: NEGATIVE
Odor: NEGATIVE
Protein, UA: POSITIVE — AB
Spec Grav, UA: 1.01 (ref 1.010–1.025)
Urobilinogen, UA: 0.2 E.U./dL
pH, UA: 6.5 (ref 5.0–8.0)
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
## 2019-02-23 ENCOUNTER — Other Ambulatory Visit: Payer: Self-pay | Admitting: Neurology

## 2019-02-23 ENCOUNTER — Encounter: Payer: Self-pay | Admitting: *Deleted

## 2019-02-23 DIAGNOSIS — I639 Cerebral infarction, unspecified: Secondary | ICD-10-CM

## 2019-02-23 NOTE — Telephone Encounter (Signed)
Dr. Jaynee Eagles sent this to [facility] mail order pharmacy on 02/19/19. I messaged pt in mychart to see if he is aware and still using it.

## 2019-02-23 NOTE — Patient Instructions (Signed)
It was a pleasure to see you today.  Continue to monitor blood pressure and call if persistently elevated.  Continue current medications.  Need to see [facility] regarding repeat colonoscopy.  Follow-up here in 6 months.  Pneumococcal 23 vaccine given today.

## 2019-02-24 ENCOUNTER — Telehealth: Payer: Self-pay | Admitting: Internal Medicine

## 2019-02-24 MED ORDER — RAMIPRIL 10 MG PO CAPS: 10.0000 mg | ORAL_CAPSULE | Freq: Every day | ORAL | 1 refills | Status: DC

## 2019-02-24 NOTE — Telephone Encounter (Signed)
Have sent new Rx for Ramipril 10 mg daily to mail order pharmacy 90 day supply. MJB. MD

## 2019-03-06 ENCOUNTER — Ambulatory Visit (INDEPENDENT_AMBULATORY_CARE_PROVIDER_SITE_OTHER): Payer: Medicare Other | Admitting: *Deleted

## 2019-03-06 DIAGNOSIS — I63431 Cerebral infarction due to embolism of right posterior cerebral artery: Secondary | ICD-10-CM

## 2019-03-06 LAB — CUP PACEART REMOTE DEVICE CHECK
Date Time Interrogation Session: 20210301003900
Implantable Pulse Generator Implant Date: 20200710

## 2019-03-06 NOTE — Progress Notes (Signed)
ILR Remote 

## 2019-03-07 ENCOUNTER — Ambulatory Visit: Payer: Medicare Other

## 2019-03-14 ENCOUNTER — Ambulatory Visit: Payer: Medicare Other | Attending: Internal Medicine

## 2019-03-14 DIAGNOSIS — Z23 Encounter for immunization: Secondary | ICD-10-CM

## 2019-03-14 NOTE — Progress Notes (Signed)
   Covid-19 Vaccination Clinic  Name:  Sean Weaver    MRN: [id number] DOB: 1955/10/17  03/14/2019  Sean Weaver was observed post Covid-19 immunization for 15 minutes without incident. He was provided with Vaccine Information Sheet and instruction to access the V-Safe system.   Sean Weaver was instructed to call 911 with any severe reactions post vaccine: Marland Kitchen Difficulty breathing  . Swelling of face and throat  . A fast heartbeat  . A bad rash all over body  . Dizziness and weakness   Immunizations Administered    Name Date Dose VIS Date Route   Pfizer COVID-19 Vaccine 03/14/2019  4:21 PM 0.3 mL 12/16/2018 Intramuscular   Manufacturer: [place]   Lot: [id number]   [place]: [id number]

## 2019-03-20 DIAGNOSIS — M2550 Pain in unspecified joint: Secondary | ICD-10-CM | POA: Diagnosis not present

## 2019-03-20 DIAGNOSIS — L4050 Arthropathic psoriasis, unspecified: Secondary | ICD-10-CM | POA: Diagnosis not present

## 2019-03-20 DIAGNOSIS — M150 Primary generalized (osteo)arthritis: Secondary | ICD-10-CM | POA: Diagnosis not present

## 2019-04-10 ENCOUNTER — Ambulatory Visit (INDEPENDENT_AMBULATORY_CARE_PROVIDER_SITE_OTHER): Payer: Medicare Other | Admitting: *Deleted

## 2019-04-10 DIAGNOSIS — I63431 Cerebral infarction due to embolism of right posterior cerebral artery: Secondary | ICD-10-CM

## 2019-04-11 ENCOUNTER — Telehealth: Payer: Self-pay | Admitting: Internal Medicine

## 2019-04-11 LAB — CUP PACEART REMOTE DEVICE CHECK
Date Time Interrogation Session: 20210401014148
Implantable Pulse Generator Implant Date: 20200710

## 2019-04-11 MED ORDER — ROSUVASTATIN CALCIUM 20 MG PO TABS: 20.0000 mg | ORAL_TABLET | Freq: Every day | ORAL | 3 refills | Status: DC

## 2019-04-11 NOTE — Telephone Encounter (Signed)
Received Fax RX request from  [facility], [place] Phone:  [phone]  Fax:  [phone]       Medication - rosuvastatin (CRESTOR) 20 MG tablet   Last Refill -  Last OV - 02/20/19  Last CPE - 02/20/19  Next Appointment - 08/21/19

## 2019-05-11 LAB — CUP PACEART REMOTE DEVICE CHECK
Date Time Interrogation Session: 20210502014457
Implantable Pulse Generator Implant Date: 20200710

## 2019-05-15 ENCOUNTER — Ambulatory Visit (INDEPENDENT_AMBULATORY_CARE_PROVIDER_SITE_OTHER): Payer: Medicare Other | Admitting: *Deleted

## 2019-05-15 DIAGNOSIS — I63431 Cerebral infarction due to embolism of right posterior cerebral artery: Secondary | ICD-10-CM

## 2019-05-15 NOTE — Progress Notes (Signed)
Carelink Summary Report / Loop Recorder 

## 2019-06-19 ENCOUNTER — Ambulatory Visit (INDEPENDENT_AMBULATORY_CARE_PROVIDER_SITE_OTHER): Payer: Medicare Other | Admitting: *Deleted

## 2019-06-19 DIAGNOSIS — I63431 Cerebral infarction due to embolism of right posterior cerebral artery: Secondary | ICD-10-CM

## 2019-06-19 LAB — CUP PACEART REMOTE DEVICE CHECK
Date Time Interrogation Session: 20210614001053
Implantable Pulse Generator Implant Date: 20200710

## 2019-06-19 NOTE — Progress Notes (Signed)
Carelink Summary Report / Loop Recorder 

## 2019-07-06 ENCOUNTER — Other Ambulatory Visit: Payer: Self-pay | Admitting: Internal Medicine

## 2019-07-13 ENCOUNTER — Other Ambulatory Visit: Payer: Self-pay | Admitting: Internal Medicine

## 2019-07-24 ENCOUNTER — Ambulatory Visit (INDEPENDENT_AMBULATORY_CARE_PROVIDER_SITE_OTHER): Payer: Medicare Other | Admitting: *Deleted

## 2019-07-24 DIAGNOSIS — I63431 Cerebral infarction due to embolism of right posterior cerebral artery: Secondary | ICD-10-CM

## 2019-07-24 LAB — CUP PACEART REMOTE DEVICE CHECK
Date Time Interrogation Session: 20210718231212
Implantable Pulse Generator Implant Date: 20200710

## 2019-07-25 NOTE — Progress Notes (Signed)
Carelink Summary Report / Loop Recorder 

## 2019-07-29 ENCOUNTER — Other Ambulatory Visit: Payer: Self-pay | Admitting: Internal Medicine

## 2019-08-14 ENCOUNTER — Other Ambulatory Visit: Payer: Self-pay

## 2019-08-14 ENCOUNTER — Other Ambulatory Visit: Payer: Medicare Other | Admitting: Internal Medicine

## 2019-08-14 DIAGNOSIS — E7800 Pure hypercholesterolemia, unspecified: Secondary | ICD-10-CM | POA: Diagnosis not present

## 2019-08-14 LAB — HEPATIC FUNCTION PANEL
AG Ratio: 1.9 (calc) (ref 1.0–2.5)
ALT: 14 U/L (ref 9–46)
AST: 16 U/L (ref 10–35)
Albumin: 4.5 g/dL (ref 3.6–5.1)
Alkaline phosphatase (APISO): 79 U/L (ref 35–144)
Bilirubin, Direct: 0.2 mg/dL (ref 0.0–0.2)
Globulin: 2.4 g/dL (calc) (ref 1.9–3.7)
Indirect Bilirubin: 1 mg/dL (calc) (ref 0.2–1.2)
Total Bilirubin: 1.2 mg/dL (ref 0.2–1.2)
Total Protein: 6.9 g/dL (ref 6.1–8.1)

## 2019-08-14 LAB — LIPID PANEL
Cholesterol: 182 mg/dL (ref <200)
HDL: 72 mg/dL (ref >=40)
LDL Cholesterol (Calc): 96 mg/dL (calc)
Non-HDL Cholesterol (Calc): 110 mg/dL (calc) (ref <130)
Total CHOL/HDL Ratio: 2.5 (calc) (ref <5.0)
Triglycerides: 58 mg/dL (ref <150)

## 2019-08-21 ENCOUNTER — Other Ambulatory Visit: Payer: Self-pay

## 2019-08-21 ENCOUNTER — Encounter: Payer: Self-pay | Admitting: Internal Medicine

## 2019-08-21 ENCOUNTER — Ambulatory Visit (INDEPENDENT_AMBULATORY_CARE_PROVIDER_SITE_OTHER): Payer: Medicare Other | Admitting: Internal Medicine

## 2019-08-21 VITALS — BP 130/70 | HR 105 | Ht 65.0 in | Wt 135.0 lb

## 2019-08-21 DIAGNOSIS — Z8673 Personal history of transient ischemic attack (TIA), and cerebral infarction without residual deficits: Secondary | ICD-10-CM

## 2019-08-21 DIAGNOSIS — E039 Hypothyroidism, unspecified: Secondary | ICD-10-CM

## 2019-08-21 DIAGNOSIS — Z7901 Long term (current) use of anticoagulants: Secondary | ICD-10-CM

## 2019-08-21 DIAGNOSIS — E7800 Pure hypercholesterolemia, unspecified: Secondary | ICD-10-CM | POA: Diagnosis not present

## 2019-08-21 DIAGNOSIS — Z86010 Personal history of colonic polyps: Secondary | ICD-10-CM | POA: Diagnosis not present

## 2019-08-21 DIAGNOSIS — Z87891 Personal history of nicotine dependence: Secondary | ICD-10-CM | POA: Diagnosis not present

## 2019-08-21 DIAGNOSIS — L4050 Arthropathic psoriasis, unspecified: Secondary | ICD-10-CM

## 2019-08-21 DIAGNOSIS — I10 Essential (primary) hypertension: Secondary | ICD-10-CM | POA: Diagnosis not present

## 2019-08-21 NOTE — Progress Notes (Signed)
° °  Subjective:    Patient ID: Sean Weaver, male    DOB: 05/29/1955, 64 y.o.   MRN: [id number]  HPI  64 year old Male seen for 6 month recheck.  No new complaints.  He was last seen here in February for health maintenance exam and welcome to Medicare physical exam.  He has a history of psoriasis and is being treated by Dr. Lenna Gilford for psoriatic arthritis.  Followed by Dr. Jaynee Eagles after episode of monocular vision loss several years ago.  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  Carotids were patent on MRI.  Loop recorder was placed in 2020.  He is monitored by cardiology.  He remains on Plavix.  History of hypothyroidism diagnosed in 1999 and stable on thyroid replacement medication.  He is on statin medication consisting of Crestor 20 mg daily.  Started on Lipitor in 1999 and switched to Crestor.  In July 2021 he was started on Wellbutrin.  He takes Altace for hypertension.  Became hypertensive around year 2000  Lipid panel is entirely within normal limits as are liver functions.  His TSH was checked in February and was not checked with this visit.  His TSH in February was normal.  Has smoked for many years a pack of cigarettes daily.  Had adenomatous polyps removed in 2007.  Needs repeat study.  Review of Systems see above     Objective:   Physical Exam  Blood pressure 130/70 pulse 105 pulse oximetry 98% weight 135 pounds BMI 22.47  Skin warm and dry.  Nodes none.  No carotid bruits.  Chest is clear to auscultation.  Cardiac exam: Tachycardia regular rate and rhythm.  Extremities without edema.  Affect thought and judgment are normal.         History of right occipital ischemic infarction.  Patient has loop recorder and is followed by cardiology and Dr. Jaynee Eagles.  Patient is on chronic anticoagulation  Hyperlipidemia-lipid panel is within normal limits on statin therapy  Essential hypertension stable on current  regimen  Hypothyroidism-stable on thyroid replacement medication  Psoriatic arthritis treated by rheumatologist with monoclonal antibody inhibitor  History of smoking-longstanding  History of adenomatous colon polyps-needs repeat study  Plan: Continue current medications.  Asked patient to consider smoking cessation.  Return in 6 months for health maintenance exam and fasting labs.  Have COVID-19 booster when available.  Take annual flu vaccine.

## 2019-08-27 LAB — CUP PACEART REMOTE DEVICE CHECK
Date Time Interrogation Session: 20210819000935
Implantable Pulse Generator Implant Date: 20200710

## 2019-08-28 ENCOUNTER — Ambulatory Visit (INDEPENDENT_AMBULATORY_CARE_PROVIDER_SITE_OTHER): Payer: Medicare Other | Admitting: *Deleted

## 2019-08-28 DIAGNOSIS — I63431 Cerebral infarction due to embolism of right posterior cerebral artery: Secondary | ICD-10-CM | POA: Diagnosis not present

## 2019-08-28 NOTE — Progress Notes (Signed)
Carelink Summary Report / Loop Recorder 

## 2019-09-18 DIAGNOSIS — D3132 Benign neoplasm of left choroid: Secondary | ICD-10-CM | POA: Diagnosis not present

## 2019-09-18 DIAGNOSIS — H2513 Age-related nuclear cataract, bilateral: Secondary | ICD-10-CM | POA: Diagnosis not present

## 2019-09-18 DIAGNOSIS — H0100A Unspecified blepharitis right eye, upper and lower eyelids: Secondary | ICD-10-CM | POA: Diagnosis not present

## 2019-09-24 NOTE — Patient Instructions (Signed)
It was a pleasure to see you today.  Continue current medications and follow-up in 6 months.  Consider repeat colonoscopy.  Have annual flu vaccine.  Have COVID-19 booster when available.

## 2019-10-01 LAB — CUP PACEART REMOTE DEVICE CHECK
Date Time Interrogation Session: 20210919002946
Implantable Pulse Generator Implant Date: 20200710

## 2019-10-02 ENCOUNTER — Ambulatory Visit (INDEPENDENT_AMBULATORY_CARE_PROVIDER_SITE_OTHER): Payer: Medicare Other | Admitting: Emergency Medicine

## 2019-10-02 DIAGNOSIS — I63431 Cerebral infarction due to embolism of right posterior cerebral artery: Secondary | ICD-10-CM

## 2019-10-04 NOTE — Progress Notes (Signed)
Carelink Summary Report / Loop Recorder 

## 2019-10-25 ENCOUNTER — Ambulatory Visit (INDEPENDENT_AMBULATORY_CARE_PROVIDER_SITE_OTHER): Payer: Medicare Other

## 2019-10-25 DIAGNOSIS — I63431 Cerebral infarction due to embolism of right posterior cerebral artery: Secondary | ICD-10-CM

## 2019-10-25 LAB — CUP PACEART REMOTE DEVICE CHECK
Date Time Interrogation Session: 20211020021249
Implantable Pulse Generator Implant Date: 20200710

## 2019-10-31 NOTE — Progress Notes (Signed)
Carelink Summary Report / Loop Recorder 

## 2019-11-20 DIAGNOSIS — Z79899 Other long term (current) drug therapy: Secondary | ICD-10-CM | POA: Diagnosis not present

## 2019-11-20 DIAGNOSIS — M150 Primary generalized (osteo)arthritis: Secondary | ICD-10-CM | POA: Diagnosis not present

## 2019-11-20 DIAGNOSIS — M2550 Pain in unspecified joint: Secondary | ICD-10-CM | POA: Diagnosis not present

## 2019-11-20 DIAGNOSIS — R5383 Other fatigue: Secondary | ICD-10-CM | POA: Diagnosis not present

## 2019-11-20 DIAGNOSIS — L4050 Arthropathic psoriasis, unspecified: Secondary | ICD-10-CM | POA: Diagnosis not present

## 2019-11-20 DIAGNOSIS — L409 Psoriasis, unspecified: Secondary | ICD-10-CM | POA: Diagnosis not present

## 2019-11-25 LAB — CUP PACEART REMOTE DEVICE CHECK
Date Time Interrogation Session: 20211120013225
Implantable Pulse Generator Implant Date: 20200710

## 2019-11-27 ENCOUNTER — Ambulatory Visit (INDEPENDENT_AMBULATORY_CARE_PROVIDER_SITE_OTHER): Payer: Medicare Other

## 2019-11-27 DIAGNOSIS — I63431 Cerebral infarction due to embolism of right posterior cerebral artery: Secondary | ICD-10-CM | POA: Diagnosis not present

## 2019-11-27 DIAGNOSIS — R768 Other specified abnormal immunological findings in serum: Secondary | ICD-10-CM | POA: Diagnosis not present

## 2019-11-28 NOTE — Progress Notes (Signed)
Carelink Summary Report / Loop Recorder 

## 2019-12-26 LAB — CUP PACEART REMOTE DEVICE CHECK
Date Time Interrogation Session: 20211221013228
Implantable Pulse Generator Implant Date: 20200710

## 2020-01-01 ENCOUNTER — Ambulatory Visit (INDEPENDENT_AMBULATORY_CARE_PROVIDER_SITE_OTHER): Payer: Medicare Other

## 2020-01-01 DIAGNOSIS — I63431 Cerebral infarction due to embolism of right posterior cerebral artery: Secondary | ICD-10-CM

## 2020-01-15 ENCOUNTER — Other Ambulatory Visit: Payer: Self-pay | Admitting: Internal Medicine

## 2020-01-15 NOTE — Progress Notes (Signed)
Carelink Summary Report / Loop Recorder 

## 2020-02-04 LAB — CUP PACEART REMOTE DEVICE CHECK
Date Time Interrogation Session: 20220129234318
Implantable Pulse Generator Implant Date: 20200710

## 2020-02-05 ENCOUNTER — Ambulatory Visit (INDEPENDENT_AMBULATORY_CARE_PROVIDER_SITE_OTHER): Payer: Medicare Other

## 2020-02-05 DIAGNOSIS — I63431 Cerebral infarction due to embolism of right posterior cerebral artery: Secondary | ICD-10-CM

## 2020-02-06 ENCOUNTER — Other Ambulatory Visit: Payer: Self-pay | Admitting: Neurology

## 2020-02-06 DIAGNOSIS — I639 Cerebral infarction, unspecified: Secondary | ICD-10-CM

## 2020-02-13 ENCOUNTER — Other Ambulatory Visit: Payer: Self-pay | Admitting: Neurology

## 2020-02-13 DIAGNOSIS — I639 Cerebral infarction, unspecified: Secondary | ICD-10-CM

## 2020-02-13 NOTE — Progress Notes (Signed)
Carelink Summary Report / Loop Recorder 

## 2020-02-18 ENCOUNTER — Other Ambulatory Visit: Payer: Self-pay | Admitting: Neurology

## 2020-02-18 DIAGNOSIS — I639 Cerebral infarction, unspecified: Secondary | ICD-10-CM

## 2020-02-18 MED ORDER — CLOPIDOGREL BISULFATE 75 MG PO TABS: 75.0000 mg | ORAL_TABLET | Freq: Every day | ORAL | 4 refills | Status: DC

## 2020-02-19 ENCOUNTER — Other Ambulatory Visit: Payer: Medicare Other | Admitting: Internal Medicine

## 2020-02-19 ENCOUNTER — Other Ambulatory Visit: Payer: Self-pay

## 2020-02-19 DIAGNOSIS — I10 Essential (primary) hypertension: Secondary | ICD-10-CM

## 2020-02-19 DIAGNOSIS — E039 Hypothyroidism, unspecified: Secondary | ICD-10-CM

## 2020-02-19 DIAGNOSIS — L4050 Arthropathic psoriasis, unspecified: Secondary | ICD-10-CM | POA: Diagnosis not present

## 2020-02-19 DIAGNOSIS — R7989 Other specified abnormal findings of blood chemistry: Secondary | ICD-10-CM | POA: Diagnosis not present

## 2020-02-19 DIAGNOSIS — E7800 Pure hypercholesterolemia, unspecified: Secondary | ICD-10-CM

## 2020-02-19 DIAGNOSIS — Z0000 Encounter for general adult medical examination without abnormal findings: Secondary | ICD-10-CM

## 2020-02-19 DIAGNOSIS — R7302 Impaired glucose tolerance (oral): Secondary | ICD-10-CM

## 2020-02-19 DIAGNOSIS — Z7901 Long term (current) use of anticoagulants: Secondary | ICD-10-CM

## 2020-02-20 LAB — CBC WITH DIFFERENTIAL/PLATELET
Absolute Monocytes: 616 cells/uL (ref 200–950)
Basophils Absolute: 67 cells/uL (ref 0–200)
Basophils Relative: 1.2 %
Eosinophils Absolute: 118 cells/uL (ref 15–500)
Eosinophils Relative: 2.1 %
HCT: 44.7 % (ref 38.5–50.0)
Hemoglobin: 15.3 g/dL (ref 13.2–17.1)
Lymphs Abs: 1417 cells/uL (ref 850–3900)
MCH: 32 pg (ref 27.0–33.0)
MCHC: 34.2 g/dL (ref 32.0–36.0)
MCV: 93.5 fL (ref 80.0–100.0)
MPV: 10.6 fL (ref 7.5–12.5)
Monocytes Relative: 11 %
Neutro Abs: 3382 cells/uL (ref 1500–7800)
Neutrophils Relative %: 60.4 %
Platelets: 267 10*3/uL (ref 140–400)
RBC: 4.78 10*6/uL (ref 4.20–5.80)
RDW: 12.7 % (ref 11.0–15.0)
Total Lymphocyte: 25.3 %
WBC: 5.6 10*3/uL (ref 3.8–10.8)

## 2020-02-20 LAB — COMPLETE METABOLIC PANEL WITH GFR
AG Ratio: 1.8 (calc) (ref 1.0–2.5)
ALT: 14 U/L (ref 9–46)
AST: 16 U/L (ref 10–35)
Albumin: 4.3 g/dL (ref 3.6–5.1)
Alkaline phosphatase (APISO): 62 U/L (ref 35–144)
BUN/Creatinine Ratio: 15 (calc) (ref 6–22)
BUN: 19 mg/dL (ref 7–25)
CO2: 27 mmol/L (ref 20–32)
Calcium: 9.2 mg/dL (ref 8.6–10.3)
Chloride: 103 mmol/L (ref 98–110)
Creat: 1.28 mg/dL — ABNORMAL HIGH (ref 0.70–1.25)
GFR, Est African American: 67 mL/min/{1.73_m2} (ref >=60)
GFR, Est Non African American: 58 mL/min/{1.73_m2} — ABNORMAL LOW (ref >=60)
Globulin: 2.4 g/dL (calc) (ref 1.9–3.7)
Glucose, Bld: 101 mg/dL — ABNORMAL HIGH (ref 65–99)
Potassium: 4.8 mmol/L (ref 3.5–5.3)
Sodium: 137 mmol/L (ref 135–146)
Total Bilirubin: 0.9 mg/dL (ref 0.2–1.2)
Total Protein: 6.7 g/dL (ref 6.1–8.1)

## 2020-02-20 LAB — HEMOGLOBIN A1C
Hgb A1c MFr Bld: 5.5 % of total Hgb (ref <5.7)
Mean Plasma Glucose: 111 mg/dL
eAG (mmol/L): 6.2 mmol/L

## 2020-02-20 LAB — LIPID PANEL
Cholesterol: 196 mg/dL (ref <200)
HDL: 78 mg/dL (ref >=40)
LDL Cholesterol (Calc): 103 mg/dL (calc) — ABNORMAL HIGH
Non-HDL Cholesterol (Calc): 118 mg/dL (calc) (ref <130)
Total CHOL/HDL Ratio: 2.5 (calc) (ref <5.0)
Triglycerides: 65 mg/dL (ref <150)

## 2020-02-20 LAB — PSA: PSA: 1.02 ng/mL (ref <=4.0)

## 2020-02-26 ENCOUNTER — Encounter: Payer: Self-pay | Admitting: Internal Medicine

## 2020-02-26 ENCOUNTER — Ambulatory Visit (INDEPENDENT_AMBULATORY_CARE_PROVIDER_SITE_OTHER): Payer: Medicare Other | Admitting: Internal Medicine

## 2020-02-26 ENCOUNTER — Other Ambulatory Visit: Payer: Self-pay

## 2020-02-26 VITALS — BP 128/80 | HR 89 | Ht 65.0 in | Wt 133.0 lb

## 2020-02-26 DIAGNOSIS — Z0000 Encounter for general adult medical examination without abnormal findings: Secondary | ICD-10-CM

## 2020-02-26 DIAGNOSIS — Z872 Personal history of diseases of the skin and subcutaneous tissue: Secondary | ICD-10-CM

## 2020-02-26 DIAGNOSIS — Z860101 Personal history of adenomatous and serrated colon polyps: Secondary | ICD-10-CM

## 2020-02-26 DIAGNOSIS — E039 Hypothyroidism, unspecified: Secondary | ICD-10-CM

## 2020-02-26 DIAGNOSIS — Z86010 Personal history of colonic polyps: Secondary | ICD-10-CM | POA: Diagnosis not present

## 2020-02-26 DIAGNOSIS — E7800 Pure hypercholesterolemia, unspecified: Secondary | ICD-10-CM | POA: Diagnosis not present

## 2020-02-26 DIAGNOSIS — Z87891 Personal history of nicotine dependence: Secondary | ICD-10-CM | POA: Diagnosis not present

## 2020-02-26 DIAGNOSIS — L4050 Arthropathic psoriasis, unspecified: Secondary | ICD-10-CM | POA: Diagnosis not present

## 2020-02-26 DIAGNOSIS — I10 Essential (primary) hypertension: Secondary | ICD-10-CM | POA: Diagnosis not present

## 2020-02-26 DIAGNOSIS — Z7901 Long term (current) use of anticoagulants: Secondary | ICD-10-CM | POA: Diagnosis not present

## 2020-02-26 DIAGNOSIS — R7989 Other specified abnormal findings of blood chemistry: Secondary | ICD-10-CM

## 2020-02-26 DIAGNOSIS — Z8673 Personal history of transient ischemic attack (TIA), and cerebral infarction without residual deficits: Secondary | ICD-10-CM | POA: Diagnosis not present

## 2020-02-26 LAB — POCT URINALYSIS DIPSTICK
Appearance: NEGATIVE
Bilirubin, UA: NEGATIVE
Blood, UA: NEGATIVE
Glucose, UA: NEGATIVE
Ketones, UA: NEGATIVE
Leukocytes, UA: NEGATIVE
Nitrite, UA: NEGATIVE
Odor: NEGATIVE
Protein, UA: NEGATIVE
Spec Grav, UA: 1.01 (ref 1.010–1.025)
Urobilinogen, UA: 0.2 E.U./dL
pH, UA: 7 (ref 5.0–8.0)

## 2020-02-26 LAB — CREATININE, SERUM: Creat: 1.17 mg/dL (ref 0.70–1.25)
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
## 2020-02-28 NOTE — Patient Instructions (Signed)
It was a pleasure to see you today.  Continue current medications and follow-up in 1 year or as needed.

## 2020-03-11 ENCOUNTER — Ambulatory Visit (INDEPENDENT_AMBULATORY_CARE_PROVIDER_SITE_OTHER): Payer: Medicare Other

## 2020-03-11 DIAGNOSIS — I63431 Cerebral infarction due to embolism of right posterior cerebral artery: Secondary | ICD-10-CM

## 2020-03-13 LAB — CUP PACEART REMOTE DEVICE CHECK
Date Time Interrogation Session: 20220301234350
Implantable Pulse Generator Implant Date: 20200710

## 2020-03-19 NOTE — Progress Notes (Signed)
Carelink Summary Report / Loop Recorder 

## 2020-04-01 ENCOUNTER — Other Ambulatory Visit: Payer: Self-pay | Admitting: Internal Medicine

## 2020-04-08 DIAGNOSIS — L309 Dermatitis, unspecified: Secondary | ICD-10-CM | POA: Diagnosis not present

## 2020-04-08 DIAGNOSIS — D485 Neoplasm of uncertain behavior of skin: Secondary | ICD-10-CM | POA: Diagnosis not present

## 2020-04-08 DIAGNOSIS — L821 Other seborrheic keratosis: Secondary | ICD-10-CM | POA: Diagnosis not present

## 2020-04-08 DIAGNOSIS — L570 Actinic keratosis: Secondary | ICD-10-CM | POA: Diagnosis not present

## 2020-04-08 DIAGNOSIS — D225 Melanocytic nevi of trunk: Secondary | ICD-10-CM | POA: Diagnosis not present

## 2020-04-13 LAB — CUP PACEART REMOTE DEVICE CHECK
Date Time Interrogation Session: 20220402004736
Implantable Pulse Generator Implant Date: 20200710

## 2020-04-15 ENCOUNTER — Ambulatory Visit (INDEPENDENT_AMBULATORY_CARE_PROVIDER_SITE_OTHER): Payer: Medicare Other

## 2020-04-15 DIAGNOSIS — I63431 Cerebral infarction due to embolism of right posterior cerebral artery: Secondary | ICD-10-CM | POA: Diagnosis not present

## 2020-04-26 NOTE — Progress Notes (Signed)
Carelink Summary Report / Loop Recorder 

## 2020-05-18 ENCOUNTER — Other Ambulatory Visit: Payer: Self-pay | Admitting: Neurology

## 2020-05-18 MED ORDER — AMOXICILLIN-POT CLAVULANATE 875-125 MG PO TABS: 1.0000 | ORAL_TABLET | Freq: Two times a day (BID) | ORAL | 0 refills | Status: DC

## 2020-05-20 ENCOUNTER — Ambulatory Visit (INDEPENDENT_AMBULATORY_CARE_PROVIDER_SITE_OTHER): Payer: Medicare Other

## 2020-05-20 DIAGNOSIS — Z79899 Other long term (current) drug therapy: Secondary | ICD-10-CM | POA: Diagnosis not present

## 2020-05-20 DIAGNOSIS — L409 Psoriasis, unspecified: Secondary | ICD-10-CM | POA: Diagnosis not present

## 2020-05-20 DIAGNOSIS — M150 Primary generalized (osteo)arthritis: Secondary | ICD-10-CM | POA: Diagnosis not present

## 2020-05-20 DIAGNOSIS — L4050 Arthropathic psoriasis, unspecified: Secondary | ICD-10-CM | POA: Diagnosis not present

## 2020-05-20 DIAGNOSIS — I63431 Cerebral infarction due to embolism of right posterior cerebral artery: Secondary | ICD-10-CM

## 2020-05-20 DIAGNOSIS — Z6821 Body mass index (BMI) 21.0-21.9, adult: Secondary | ICD-10-CM | POA: Diagnosis not present

## 2020-05-21 LAB — CUP PACEART REMOTE DEVICE CHECK
Date Time Interrogation Session: 20220514230535
Implantable Pulse Generator Implant Date: 20200710

## 2020-06-07 ENCOUNTER — Other Ambulatory Visit: Payer: Self-pay | Admitting: Internal Medicine

## 2020-06-09 ENCOUNTER — Other Ambulatory Visit: Payer: Self-pay | Admitting: Internal Medicine

## 2020-06-11 NOTE — Progress Notes (Signed)
Carelink Summary Report / Loop Recorder 

## 2020-06-24 ENCOUNTER — Ambulatory Visit (INDEPENDENT_AMBULATORY_CARE_PROVIDER_SITE_OTHER): Payer: Medicare Other

## 2020-06-24 DIAGNOSIS — I63431 Cerebral infarction due to embolism of right posterior cerebral artery: Secondary | ICD-10-CM | POA: Diagnosis not present

## 2020-06-25 LAB — CUP PACEART REMOTE DEVICE CHECK
Date Time Interrogation Session: 20220614232821
Implantable Pulse Generator Implant Date: 20200710

## 2020-06-27 ENCOUNTER — Ambulatory Visit (INDEPENDENT_AMBULATORY_CARE_PROVIDER_SITE_OTHER): Payer: Medicare Other | Admitting: Internal Medicine

## 2020-06-27 ENCOUNTER — Other Ambulatory Visit: Payer: Self-pay

## 2020-06-27 ENCOUNTER — Encounter: Payer: Self-pay | Admitting: Internal Medicine

## 2020-06-27 VITALS — BP 130/80 | HR 92 | Temp 97.7°F | Wt 131.4 lb

## 2020-06-27 DIAGNOSIS — H9209 Otalgia, unspecified ear: Secondary | ICD-10-CM | POA: Diagnosis not present

## 2020-06-27 DIAGNOSIS — H6503 Acute serous otitis media, bilateral: Secondary | ICD-10-CM

## 2020-06-27 MED ORDER — METHYLPREDNISOLONE ACETATE 80 MG/ML IJ SUSP
80.0000 mg | Freq: Once | INTRAMUSCULAR | Status: AC
  Administered 2020-06-27: 80 mg via INTRAMUSCULAR

## 2020-06-27 MED ORDER — AZITHROMYCIN 250 MG PO TABS: ORAL_TABLET | ORAL | 0 refills | Status: AC

## 2020-06-29 NOTE — Patient Instructions (Signed)
It was a pleasure to see you today.  Sorry to hear you are not feeling well.  Take Zithromax Z-PAK 2 tabs day 1 followed by 1 tab days 2 through 5.  Depo-Medrol 80 mg IM given in office today.

## 2020-06-29 NOTE — Progress Notes (Signed)
   Subjective:    Patient ID: Sean Weaver, male    DOB: 06/20/1955, 65 y.o.   MRN: [id number]  HPI 65 year old Male seen today with bilateral ear pain.  He has not been swimming.  Has no discharge from his ears.  No pain in his ear canals.  No cough cold or sore throat.  No recent travel history or flying in an airplane.  He has hypertension and hypothyroidism.  Was started on Lipitor in 1999 for hyperlipidemia.  Remote history of right occipital ischemic infarction.  Had cardiology evaluation and loop recorder placed in 2020.  Was thought to have had cerebral infarction due to embolism of right posterior cerebral artery.  Loop recorder was placed in 2020.  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  Remote history of cluster headaches in September 2016.  He is on Plavix 75 mg daily.  He has psoriasis and psoriatic arthritis and is on Humira  Review of Systems no fever, chills, nausea, vomiting, diarrhea or headache     Objective:   Physical Exam Blood pressure 130/80 pulse 92 temperature 97.7 pulse oximetry 96% weight 131 pounds 6 ounces.  BMI 21.86  Skin: Warm and dry.  TMs are maybe slightly full but certainly not red.  Pharynx is clear.  Neck is supple without adenopathy.  Chest is clear to auscultation.       Assessment & Plan:   Probable bilateral serous otitis media  Plan: Depo-Medrol 80 mg IM given in office today.  Take Zithromax Z-PAK 2 tabs day 1 followed by 1 tab days 2 through 5.  Call if not better in 7 to 10 days or sooner if worse.

## 2020-07-12 NOTE — Progress Notes (Signed)
Carelink Summary Report / Loop Recorder 

## 2020-07-24 LAB — CUP PACEART REMOTE DEVICE CHECK
Date Time Interrogation Session: 20220716002656
Implantable Pulse Generator Implant Date: 20200710

## 2020-07-29 ENCOUNTER — Ambulatory Visit (INDEPENDENT_AMBULATORY_CARE_PROVIDER_SITE_OTHER): Payer: Medicare Other

## 2020-07-29 DIAGNOSIS — I63431 Cerebral infarction due to embolism of right posterior cerebral artery: Secondary | ICD-10-CM | POA: Diagnosis not present

## 2020-08-12 ENCOUNTER — Other Ambulatory Visit: Payer: Self-pay

## 2020-08-12 ENCOUNTER — Other Ambulatory Visit: Payer: Medicare Other | Admitting: Internal Medicine

## 2020-08-12 DIAGNOSIS — R7302 Impaired glucose tolerance (oral): Secondary | ICD-10-CM | POA: Diagnosis not present

## 2020-08-12 DIAGNOSIS — E7800 Pure hypercholesterolemia, unspecified: Secondary | ICD-10-CM

## 2020-08-12 DIAGNOSIS — E039 Hypothyroidism, unspecified: Secondary | ICD-10-CM

## 2020-08-12 DIAGNOSIS — Z872 Personal history of diseases of the skin and subcutaneous tissue: Secondary | ICD-10-CM | POA: Diagnosis not present

## 2020-08-13 LAB — HEPATIC FUNCTION PANEL
AG Ratio: 1.7 (calc) (ref 1.0–2.5)
ALT: 13 U/L (ref 9–46)
AST: 15 U/L (ref 10–35)
Albumin: 4.2 g/dL (ref 3.6–5.1)
Alkaline phosphatase (APISO): 57 U/L (ref 35–144)
Bilirubin, Direct: 0.2 mg/dL (ref 0.0–0.2)
Globulin: 2.5 g/dL (calc) (ref 1.9–3.7)
Indirect Bilirubin: 0.9 mg/dL (calc) (ref 0.2–1.2)
Total Bilirubin: 1.1 mg/dL (ref 0.2–1.2)
Total Protein: 6.7 g/dL (ref 6.1–8.1)

## 2020-08-13 LAB — LIPID PANEL
Cholesterol: 174 mg/dL (ref <200)
HDL: 74 mg/dL (ref >=40)
LDL Cholesterol (Calc): 87 mg/dL (calc)
Non-HDL Cholesterol (Calc): 100 mg/dL (calc) (ref <130)
Total CHOL/HDL Ratio: 2.4 (calc) (ref <5.0)
Triglycerides: 50 mg/dL (ref <150)

## 2020-08-13 LAB — HEMOGLOBIN A1C
Hgb A1c MFr Bld: 5.4 % of total Hgb (ref <5.7)
Mean Plasma Glucose: 108 mg/dL
eAG (mmol/L): 6 mmol/L

## 2020-08-13 LAB — TSH: TSH: 0.84 mIU/L (ref 0.40–4.50)

## 2020-08-19 ENCOUNTER — Ambulatory Visit: Payer: Medicare Other | Admitting: Internal Medicine

## 2020-08-20 ENCOUNTER — Ambulatory Visit (INDEPENDENT_AMBULATORY_CARE_PROVIDER_SITE_OTHER): Payer: Medicare Other

## 2020-08-20 DIAGNOSIS — I63431 Cerebral infarction due to embolism of right posterior cerebral artery: Secondary | ICD-10-CM | POA: Diagnosis not present

## 2020-08-20 LAB — CUP PACEART REMOTE DEVICE CHECK
Date Time Interrogation Session: 20220816005722
Implantable Pulse Generator Implant Date: 20200710

## 2020-08-20 NOTE — Progress Notes (Signed)
Carelink Summary Report / Loop Recorder 

## 2020-09-02 ENCOUNTER — Other Ambulatory Visit: Payer: Self-pay

## 2020-09-02 ENCOUNTER — Encounter: Payer: Self-pay | Admitting: Internal Medicine

## 2020-09-02 ENCOUNTER — Ambulatory Visit (INDEPENDENT_AMBULATORY_CARE_PROVIDER_SITE_OTHER): Payer: Medicare Other | Admitting: Internal Medicine

## 2020-09-02 VITALS — BP 120/70 | HR 110 | Ht 65.0 in | Wt 128.0 lb

## 2020-09-02 DIAGNOSIS — E7800 Pure hypercholesterolemia, unspecified: Secondary | ICD-10-CM | POA: Diagnosis not present

## 2020-09-02 DIAGNOSIS — Z7901 Long term (current) use of anticoagulants: Secondary | ICD-10-CM | POA: Diagnosis not present

## 2020-09-02 DIAGNOSIS — Z87891 Personal history of nicotine dependence: Secondary | ICD-10-CM

## 2020-09-02 DIAGNOSIS — L4050 Arthropathic psoriasis, unspecified: Secondary | ICD-10-CM | POA: Diagnosis not present

## 2020-09-02 DIAGNOSIS — E039 Hypothyroidism, unspecified: Secondary | ICD-10-CM

## 2020-09-02 DIAGNOSIS — I10 Essential (primary) hypertension: Secondary | ICD-10-CM

## 2020-09-02 DIAGNOSIS — Z8673 Personal history of transient ischemic attack (TIA), and cerebral infarction without residual deficits: Secondary | ICD-10-CM | POA: Diagnosis not present

## 2020-09-02 DIAGNOSIS — Z872 Personal history of diseases of the skin and subcutaneous tissue: Secondary | ICD-10-CM | POA: Diagnosis not present

## 2020-09-02 NOTE — Progress Notes (Signed)
   Subjective:    Patient ID: Sean Weaver, male    DOB: [birth year], 65 y.o.   MRN: [id number]  HPI 65 year old  Male for 6 month recheck.  He was seen in February for health maintenance exam and Medicare wellness visit.  He has a history of hypothyroidism diagnosed in 1999.  He became hypertensive in early 2000.  He was started on Lipitor 1999 for hyperlipidemia.  Had negative Cardiolite study in April 2001.  Has history of psoriasis and psoriatic arthritis treated by Dr. Trudie Reed with Humira and doing well.  In 2019 he was trimming appliance failure and experienced sudden loss of vision in his left lateral eye for a brief period of time.  It was not associated with a headache.  He had bilateral Doppler studies and was referred to Dr. Jaynee Eagles.  He has a history of cluster headaches diagnosed in 2016.  Dr. Jaynee Eagles saw him in January 2020 and was also concerned as I was.  Patient was found to have chronic right occipital ischemic infarction new compared to CT from 2016.  Dr. Jaynee Eagles polyp stroke as did Dr. Leonie Man.  She noted risk factors included tobacco use and hypertension.  He had a loop recorder and underwent a TTE by Dr. Rayann Heman.  Echocardiogram did not show a cause for stroke.  A loop recorder was placed.  Carotids were patent on MRI.  He is monitored by cardiology.  He took Plavix for a period of time but this has been discontinued.  He has hypothyroidism treated with thyroid replacement medication and is stable.  Due for repeat colonoscopy.  Last one was in 2007.  Adenomatous polyps were noted at that time.    Diagnosed with hypothyroidism 1999.  Became hypertensive in early 2000 and was started on Lipitor in 1999 for hyperlipidemia.  Had gastritis in 1996 on endoscopy by Dr. Amedeo Plenty also had duodenitis which improved with Prilosec.  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    Review of Systems no headache,  shortness of breath, chest pain     Objective:   Physical Exam Blood pressure 120/70 pulse 110 pulse oximetry 97% weight 128 pounds BMI 21.30  Skin: Warm and dry.  No cervical adenopathy.  No thyromegaly.  No carotid bruits appreciated.  Chest is clear to auscultation.  Cardiac exam: Regular rate and rhythm without ectopy.  No lower extremity pitting edema.  Psoriasis is stable on Humira per Dr. Trudie Reed.       Assessment & Plan:   Hypothyroidism-stable on thyroid replacement medication-Synthroid 100 mcg daily.  TSH is 0.84  History of impaired glucose tolerance noted a couple of years ago with hemoglobin A1c 5.8% and is now normal at 5.4%  Hyperlipidemia treated with Crestor 20 mg daily lipid panel is normal.  Liver functions are normal as well.  Essential hypertension stable on Altace.  Psoriatic arthritis and psoriasis treated with Humira  History of CVA with extensive evaluation by Dr. Jaynee Eagles and Dr. Rayann Heman.  Remains on Plavix.  Plan: Return in 6 months for Medicare wellness and health maintenance exam.  Continue medications as prescribed.  Have COVID booster this Fall.  Have flu vaccine.  Dr. Jaynee Eagles would like his LDL at 55 which is entirely reasonable with history of stroke.  He would need to increase Crestor from 20 to 40 mg daily to do that.  There is not a 30 mg dose.  He will consider it.

## 2020-09-02 NOTE — Patient Instructions (Signed)
His labs are normal. Dr. Jaynee Eagles would like LDL at 33 which is entirely reasonable. He would need to increase generic Crestor to 40 mg daily to improve LDL with medical treatment. There is not a 30 mg dose. We discussed this. He will consider it. Will continue diet and exercise efforts with current dose of Crestor generic 20 mg daily. Re-evaluate in 6 months. RTC in 6 months for health maintenance exam and Medicare wellness visit with fasting labs.

## 2020-09-08 NOTE — Progress Notes (Signed)
Carelink Summary Report / Loop Recorder 

## 2020-09-23 ENCOUNTER — Ambulatory Visit (INDEPENDENT_AMBULATORY_CARE_PROVIDER_SITE_OTHER): Payer: Medicare Other

## 2020-09-23 DIAGNOSIS — I63431 Cerebral infarction due to embolism of right posterior cerebral artery: Secondary | ICD-10-CM

## 2020-09-23 DIAGNOSIS — H2513 Age-related nuclear cataract, bilateral: Secondary | ICD-10-CM | POA: Diagnosis not present

## 2020-09-23 DIAGNOSIS — R519 Headache, unspecified: Secondary | ICD-10-CM | POA: Diagnosis not present

## 2020-09-23 DIAGNOSIS — H0102A Squamous blepharitis right eye, upper and lower eyelids: Secondary | ICD-10-CM | POA: Diagnosis not present

## 2020-09-23 DIAGNOSIS — D3132 Benign neoplasm of left choroid: Secondary | ICD-10-CM | POA: Diagnosis not present

## 2020-09-24 LAB — CUP PACEART REMOTE DEVICE CHECK
Date Time Interrogation Session: 20220916011211
Implantable Pulse Generator Implant Date: 20200710

## 2020-09-27 NOTE — Progress Notes (Signed)
Carelink Summary Report / Loop Recorder 

## 2020-10-28 ENCOUNTER — Ambulatory Visit (INDEPENDENT_AMBULATORY_CARE_PROVIDER_SITE_OTHER): Payer: Medicare Other

## 2020-10-28 DIAGNOSIS — I63431 Cerebral infarction due to embolism of right posterior cerebral artery: Secondary | ICD-10-CM

## 2020-10-28 LAB — CUP PACEART REMOTE DEVICE CHECK
Date Time Interrogation Session: 20221017011702
Implantable Pulse Generator Implant Date: 20200710

## 2020-11-05 NOTE — Progress Notes (Signed)
Carelink Summary Report / Loop Recorder 

## 2020-11-18 DIAGNOSIS — L4050 Arthropathic psoriasis, unspecified: Secondary | ICD-10-CM | POA: Diagnosis not present

## 2020-11-18 DIAGNOSIS — L409 Psoriasis, unspecified: Secondary | ICD-10-CM | POA: Diagnosis not present

## 2020-11-18 DIAGNOSIS — R5383 Other fatigue: Secondary | ICD-10-CM | POA: Diagnosis not present

## 2020-11-18 DIAGNOSIS — Z79899 Other long term (current) drug therapy: Secondary | ICD-10-CM | POA: Diagnosis not present

## 2020-11-18 DIAGNOSIS — Z6821 Body mass index (BMI) 21.0-21.9, adult: Secondary | ICD-10-CM | POA: Diagnosis not present

## 2020-11-18 DIAGNOSIS — M150 Primary generalized (osteo)arthritis: Secondary | ICD-10-CM | POA: Diagnosis not present

## 2020-11-25 LAB — CUP PACEART REMOTE DEVICE CHECK
Date Time Interrogation Session: 20221117001904
Implantable Pulse Generator Implant Date: 20200710

## 2020-12-02 ENCOUNTER — Ambulatory Visit (INDEPENDENT_AMBULATORY_CARE_PROVIDER_SITE_OTHER): Payer: Medicare Other

## 2020-12-02 DIAGNOSIS — I63431 Cerebral infarction due to embolism of right posterior cerebral artery: Secondary | ICD-10-CM

## 2020-12-09 NOTE — Progress Notes (Signed)
Carelink Summary Report / Loop Recorder 

## 2020-12-23 ENCOUNTER — Ambulatory Visit (INDEPENDENT_AMBULATORY_CARE_PROVIDER_SITE_OTHER): Payer: Medicare Other

## 2020-12-23 DIAGNOSIS — I63431 Cerebral infarction due to embolism of right posterior cerebral artery: Secondary | ICD-10-CM

## 2020-12-24 LAB — CUP PACEART REMOTE DEVICE CHECK
Date Time Interrogation Session: 20221218002505
Implantable Pulse Generator Implant Date: 20200710

## 2021-01-01 NOTE — Progress Notes (Signed)
Carelink Summary Report / Loop Recorder 

## 2021-01-20 ENCOUNTER — Other Ambulatory Visit: Payer: Self-pay | Admitting: Internal Medicine

## 2021-01-27 ENCOUNTER — Ambulatory Visit (INDEPENDENT_AMBULATORY_CARE_PROVIDER_SITE_OTHER): Payer: Medicare Other

## 2021-01-27 DIAGNOSIS — I63431 Cerebral infarction due to embolism of right posterior cerebral artery: Secondary | ICD-10-CM

## 2021-01-27 LAB — CUP PACEART REMOTE DEVICE CHECK
Date Time Interrogation Session: 20230122234050
Implantable Pulse Generator Implant Date: 20200710

## 2021-02-06 NOTE — Progress Notes (Signed)
Carelink Summary Report / Loop Recorder 

## 2021-02-20 ENCOUNTER — Other Ambulatory Visit: Payer: Self-pay | Admitting: Internal Medicine

## 2021-03-03 ENCOUNTER — Other Ambulatory Visit: Payer: Medicare Other | Admitting: Internal Medicine

## 2021-03-03 ENCOUNTER — Other Ambulatory Visit: Payer: Self-pay

## 2021-03-03 ENCOUNTER — Ambulatory Visit (INDEPENDENT_AMBULATORY_CARE_PROVIDER_SITE_OTHER): Payer: Medicare Other

## 2021-03-03 DIAGNOSIS — E039 Hypothyroidism, unspecified: Secondary | ICD-10-CM

## 2021-03-03 DIAGNOSIS — I63431 Cerebral infarction due to embolism of right posterior cerebral artery: Secondary | ICD-10-CM | POA: Diagnosis not present

## 2021-03-03 DIAGNOSIS — Z125 Encounter for screening for malignant neoplasm of prostate: Secondary | ICD-10-CM

## 2021-03-03 DIAGNOSIS — E7800 Pure hypercholesterolemia, unspecified: Secondary | ICD-10-CM | POA: Diagnosis not present

## 2021-03-03 DIAGNOSIS — I10 Essential (primary) hypertension: Secondary | ICD-10-CM

## 2021-03-03 LAB — CUP PACEART REMOTE DEVICE CHECK
Date Time Interrogation Session: 20230224234143
Implantable Pulse Generator Implant Date: 20200710

## 2021-03-04 LAB — CBC WITH DIFFERENTIAL/PLATELET
Absolute Monocytes: 708 cells/uL (ref 200–950)
Basophils Absolute: 70 cells/uL (ref 0–200)
Basophils Relative: 1.2 %
Eosinophils Absolute: 110 cells/uL (ref 15–500)
Eosinophils Relative: 1.9 %
HCT: 45.3 % (ref 38.5–50.0)
Hemoglobin: 15 g/dL (ref 13.2–17.1)
Lymphs Abs: 1531 cells/uL (ref 850–3900)
MCH: 31 pg (ref 27.0–33.0)
MCHC: 33.1 g/dL (ref 32.0–36.0)
MCV: 93.6 fL (ref 80.0–100.0)
MPV: 10.8 fL (ref 7.5–12.5)
Monocytes Relative: 12.2 %
Neutro Abs: 3381 cells/uL (ref 1500–7800)
Neutrophils Relative %: 58.3 %
Platelets: 263 10*3/uL (ref 140–400)
RBC: 4.84 10*6/uL (ref 4.20–5.80)
RDW: 12.9 % (ref 11.0–15.0)
Total Lymphocyte: 26.4 %
WBC: 5.8 10*3/uL (ref 3.8–10.8)

## 2021-03-04 LAB — LIPID PANEL
Cholesterol: 205 mg/dL — ABNORMAL HIGH (ref <200)
HDL: 86 mg/dL (ref >=40)
LDL Cholesterol (Calc): 105 mg/dL (calc) — ABNORMAL HIGH
Non-HDL Cholesterol (Calc): 119 mg/dL (calc) (ref <130)
Total CHOL/HDL Ratio: 2.4 (calc) (ref <5.0)
Triglycerides: 58 mg/dL (ref <150)

## 2021-03-04 LAB — COMPLETE METABOLIC PANEL WITH GFR
AG Ratio: 1.6 (calc) (ref 1.0–2.5)
ALT: 16 U/L (ref 9–46)
AST: 17 U/L (ref 10–35)
Albumin: 4.2 g/dL (ref 3.6–5.1)
Alkaline phosphatase (APISO): 64 U/L (ref 35–144)
BUN: 15 mg/dL (ref 7–25)
CO2: 25 mmol/L (ref 20–32)
Calcium: 9.4 mg/dL (ref 8.6–10.3)
Chloride: 102 mmol/L (ref 98–110)
Creat: 1.09 mg/dL (ref 0.70–1.35)
Globulin: 2.7 g/dL (calc) (ref 1.9–3.7)
Glucose, Bld: 92 mg/dL (ref 65–99)
Potassium: 4.8 mmol/L (ref 3.5–5.3)
Sodium: 138 mmol/L (ref 135–146)
Total Bilirubin: 0.9 mg/dL (ref 0.2–1.2)
Total Protein: 6.9 g/dL (ref 6.1–8.1)
eGFR: 74 mL/min/{1.73_m2} (ref >=60)

## 2021-03-04 LAB — PSA: PSA: 1.29 ng/mL (ref <=4.00)

## 2021-03-04 LAB — TSH: TSH: 1.68 mIU/L (ref 0.40–4.50)

## 2021-03-06 NOTE — Progress Notes (Signed)
Carelink Summary Report / Loop Recorder 

## 2021-03-10 ENCOUNTER — Other Ambulatory Visit: Payer: Self-pay

## 2021-03-10 ENCOUNTER — Encounter: Payer: Self-pay | Admitting: Internal Medicine

## 2021-03-10 ENCOUNTER — Ambulatory Visit (INDEPENDENT_AMBULATORY_CARE_PROVIDER_SITE_OTHER): Payer: Medicare Other | Admitting: Internal Medicine

## 2021-03-10 VITALS — BP 122/70 | HR 68 | Temp 98.1°F | Resp 16 | Ht 64.0 in | Wt 133.0 lb

## 2021-03-10 DIAGNOSIS — Z87891 Personal history of nicotine dependence: Secondary | ICD-10-CM | POA: Diagnosis not present

## 2021-03-10 DIAGNOSIS — Z8673 Personal history of transient ischemic attack (TIA), and cerebral infarction without residual deficits: Secondary | ICD-10-CM | POA: Diagnosis not present

## 2021-03-10 DIAGNOSIS — E7800 Pure hypercholesterolemia, unspecified: Secondary | ICD-10-CM | POA: Diagnosis not present

## 2021-03-10 DIAGNOSIS — Z7901 Long term (current) use of anticoagulants: Secondary | ICD-10-CM | POA: Diagnosis not present

## 2021-03-10 DIAGNOSIS — E039 Hypothyroidism, unspecified: Secondary | ICD-10-CM

## 2021-03-10 DIAGNOSIS — Z872 Personal history of diseases of the skin and subcutaneous tissue: Secondary | ICD-10-CM | POA: Diagnosis not present

## 2021-03-10 DIAGNOSIS — Z86010 Personal history of colonic polyps: Secondary | ICD-10-CM

## 2021-03-10 DIAGNOSIS — L4050 Arthropathic psoriasis, unspecified: Secondary | ICD-10-CM

## 2021-03-10 DIAGNOSIS — Z0000 Encounter for general adult medical examination without abnormal findings: Secondary | ICD-10-CM | POA: Diagnosis not present

## 2021-03-10 DIAGNOSIS — I10 Essential (primary) hypertension: Secondary | ICD-10-CM | POA: Diagnosis not present

## 2021-03-10 DIAGNOSIS — Z860101 Personal history of adenomatous and serrated colon polyps: Secondary | ICD-10-CM

## 2021-03-10 NOTE — Progress Notes (Unsigned)
° ° ° °  Annual Wellness Visit     Patient: Sean Weaver, Male    DOB: [birth year], 66 y.o.   MRN: [id number] Visit Date: 03/10/2021  No chief complaint on file.  Subjective    TALAN GILDNER is a 66 y.o. male who presents today for his Annual Wellness Visit.  HPI   Social History   Social History Narrative   Lives at home alone  In [place].   Right handed   Caffeine: 3-4 cups per day   Hair dressor Surveyor, mining salon)    Patient Care Team: Baxley, Cresenciano Lick, MD as PCP - General (Internal Medicine)  Review of Systems   Objective    Vitals: BP 122/70   Physical Exam   Most recent functional status assessment: In your present state of health, do you have any difficulty performing the following activities: 03/10/2021  Hearing? N  Vision? N  Difficulty concentrating or making decisions? N  Walking or climbing stairs? N  Dressing or bathing? N  Doing errands, shopping? N  Preparing Food and eating ? N  Using the Toilet? N  In the past six months, have you accidently leaked urine? N  Do you have problems with loss of bowel control? N  Managing your Medications? N  Managing your Finances? N  Housekeeping or managing your Housekeeping? N  Some recent data might be hidden   Most recent fall risk assessment: Fall Risk  03/10/2021  Falls in the past year? 0  Number falls in past yr: 0  Injury with Fall? 0  Risk for fall due to : No Fall Risks  Follow up Falls evaluation completed    Most recent depression screenings: PHQ 2/9 Scores 03/10/2021 02/26/2020  PHQ - 2 Score 0 0  PHQ- 9 Score - -   Most recent cognitive screening: 6CIT Screen 03/10/2021  What time? 0 points  Count back from 20 0 points  Months in reverse 0 points  Repeat phrase 0 points       Assessment & Plan     Annual wellness visit done today including the all of the following: Reviewed patient's Family Medical History Reviewed and updated list of patient's medical providers Assessment of  cognitive impairment was done Assessed patient's functional ability Established a written schedule for health screening [place] Completed and Reviewed  Discussed health benefits of physical activity, and encouraged him to engage in regular exercise appropriate for his age and condition.         {provider attestation***:1}   Angus Seller, CMA

## 2021-03-10 NOTE — Patient Instructions (Signed)
It was a pleasure to see you today. Continue same medications and RTC in 6 months. Have tetanus vaccine at pharmacy. ?

## 2021-04-03 ENCOUNTER — Encounter: Payer: Self-pay | Admitting: Internal Medicine

## 2021-04-03 ENCOUNTER — Telehealth: Payer: Self-pay | Admitting: Internal Medicine

## 2021-04-03 ENCOUNTER — Ambulatory Visit (INDEPENDENT_AMBULATORY_CARE_PROVIDER_SITE_OTHER): Payer: Medicare Other | Admitting: Internal Medicine

## 2021-04-03 VITALS — BP 128/78 | HR 100 | Temp 99.0°F | Ht 64.0 in | Wt 135.2 lb

## 2021-04-03 DIAGNOSIS — R0781 Pleurodynia: Secondary | ICD-10-CM

## 2021-04-03 MED ORDER — MELOXICAM 15 MG PO TABS: 15.0000 mg | ORAL_TABLET | Freq: Every day | ORAL | 0 refills | Status: DC

## 2021-04-03 NOTE — Telephone Encounter (Signed)
Sean Weaver ?[id number] ? ?[name] called to say he is having Bottom Left Rib cage pain for couple of days and would like to come in and see you, it is getting worse. He can come this afternoon at 3:30 ?

## 2021-04-03 NOTE — Patient Instructions (Signed)
Apply heat or ice to the left rib cage area.  Trial of meloxicam 15 mg daily with a meal.  Have x-ray of left rib cage and a chest x-ray.  Call if not better in 7 to 10 days or sooner if worse.  It was a pleasure to see you today and we are sorry you are not feeling well. ?

## 2021-04-03 NOTE — Progress Notes (Signed)
? ?  Subjective:  ? ? Patient ID: Sean Weaver, male    DOB: 09/14/1955, 66 y.o.   MRN: [id number] ? ?HPI One week history of left rib cage pain.Lifted cabinet in kitchen that had tipped over. It was quite heavy. He thinks he strained something in left lower chest wall anteriorly. No nausea . No vomiting. Just quite uncomfortable.  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 ? ?He has a history of hypothyroidism.  He became hypertensive in early 2000.  He was started on Lipitor 1999 for hyperlipidemia.  History of Psoriatic arthritis treated by Rheumatologist with DMARD. ? ?History of mono ocular vision loss.  Saw Dr. Jaynee Eagles.  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  Carotids were patent on MRI.  A loop recorder was placed in 2020 and he had Cardiology evaluation.  He was on Plavix for a while but that has been discontinued. ? ? ? ?Review of Systems no shortness of breath ? ?   ?Objective:  ? Physical Exam ?Vital signs are reviewed.  Blood pressure excellent at 128/78.  Pulse is 100.  Pulse oximetry 96%.  Weight 135 pounds 4 ounces.  BMI 23.22 ? ?Skin warm and dry.  Chest is clear to auscultation without rales or wheezing.  He has palpable lower left anterior rib cage pain particularly along the lower mid left anterior rib and midclavicular line. ? ?   ?Assessment & Plan:  ?I think he has musculoskeletal chest wall pain but would like to exclude possible rib fracture since this was a very heavy cabinet that he was trying to lift up. ? ?He will have chest x-ray and left rib detail films.  Have prescribed Meloxicam 15 mg daily with a meal.  May apply ice or heat to the left anterior rib cage area. ? ?

## 2021-04-03 NOTE — Telephone Encounter (Signed)
scheduled

## 2021-04-04 ENCOUNTER — Ambulatory Visit
Admission: RE | Admit: 2021-04-04 | Discharge: 2021-04-04 | Disposition: A | Discharge status: Home / Self Care | Payer: Medicare Other | Source: Ambulatory Visit | Attending: Internal Medicine | Admitting: Internal Medicine

## 2021-04-04 DIAGNOSIS — M419 Scoliosis, unspecified: Secondary | ICD-10-CM | POA: Diagnosis not present

## 2021-04-04 DIAGNOSIS — R0781 Pleurodynia: Secondary | ICD-10-CM | POA: Diagnosis not present

## 2021-04-07 ENCOUNTER — Ambulatory Visit (INDEPENDENT_AMBULATORY_CARE_PROVIDER_SITE_OTHER): Payer: Medicare Other

## 2021-04-07 DIAGNOSIS — I63431 Cerebral infarction due to embolism of right posterior cerebral artery: Secondary | ICD-10-CM

## 2021-04-07 LAB — CUP PACEART REMOTE DEVICE CHECK
Date Time Interrogation Session: 20230401002900
Implantable Pulse Generator Implant Date: 20200710

## 2021-04-10 ENCOUNTER — Other Ambulatory Visit: Payer: Self-pay | Admitting: Neurology

## 2021-04-10 DIAGNOSIS — I639 Cerebral infarction, unspecified: Secondary | ICD-10-CM

## 2021-04-15 ENCOUNTER — Other Ambulatory Visit: Payer: Self-pay | Admitting: Neurology

## 2021-04-15 ENCOUNTER — Telehealth: Payer: Self-pay | Admitting: *Deleted

## 2021-04-15 DIAGNOSIS — I639 Cerebral infarction, unspecified: Secondary | ICD-10-CM

## 2021-04-15 MED ORDER — AMOXICILLIN-POT CLAVULANATE 875-125 MG PO TABS: 1.0000 | ORAL_TABLET | Freq: Two times a day (BID) | ORAL | 0 refills | Status: DC

## 2021-04-15 MED ORDER — CLOPIDOGREL BISULFATE 75 MG PO TABS: 75.0000 mg | ORAL_TABLET | Freq: Every day | ORAL | 4 refills | Status: DC

## 2021-04-15 NOTE — Telephone Encounter (Signed)
Last seen pt 2020.  Requesting refill plavix. I refused to have pt call for appointment.  ?

## 2021-04-15 NOTE — Telephone Encounter (Signed)
Phone rep left vm for pt to call office to schedule needed f/u for med refill ?

## 2021-04-17 NOTE — Progress Notes (Signed)
Carelink Summary Report / Loop Recorder 

## 2021-04-22 ENCOUNTER — Telehealth: Payer: Self-pay | Admitting: Neurology

## 2021-04-22 NOTE — Telephone Encounter (Signed)
Sean Weaver, can you print all medical records, imaging, echocardiogram, holter monitor etc etc related to patient's stroke that we treated? He may be able to get 10k from insurance. He can come in and sign for them when you ave them ready. Let me know. ?

## 2021-05-08 LAB — CUP PACEART REMOTE DEVICE CHECK
Date Time Interrogation Session: 20230504002929
Implantable Pulse Generator Implant Date: 20200710

## 2021-05-12 ENCOUNTER — Ambulatory Visit (INDEPENDENT_AMBULATORY_CARE_PROVIDER_SITE_OTHER): Payer: Medicare Other

## 2021-05-12 DIAGNOSIS — I63431 Cerebral infarction due to embolism of right posterior cerebral artery: Secondary | ICD-10-CM | POA: Diagnosis not present

## 2021-05-12 DIAGNOSIS — L400 Psoriasis vulgaris: Secondary | ICD-10-CM | POA: Diagnosis not present

## 2021-05-12 DIAGNOSIS — L821 Other seborrheic keratosis: Secondary | ICD-10-CM | POA: Diagnosis not present

## 2021-05-12 DIAGNOSIS — L570 Actinic keratosis: Secondary | ICD-10-CM | POA: Diagnosis not present

## 2021-05-12 DIAGNOSIS — D0439 Carcinoma in situ of skin of other parts of face: Secondary | ICD-10-CM | POA: Diagnosis not present

## 2021-05-12 DIAGNOSIS — L309 Dermatitis, unspecified: Secondary | ICD-10-CM | POA: Diagnosis not present

## 2021-05-12 DIAGNOSIS — L0889 Other specified local infections of the skin and subcutaneous tissue: Secondary | ICD-10-CM | POA: Diagnosis not present

## 2021-05-12 DIAGNOSIS — D1801 Hemangioma of skin and subcutaneous tissue: Secondary | ICD-10-CM | POA: Diagnosis not present

## 2021-05-15 ENCOUNTER — Other Ambulatory Visit: Payer: Self-pay | Admitting: Internal Medicine

## 2021-05-19 DIAGNOSIS — L4050 Arthropathic psoriasis, unspecified: Secondary | ICD-10-CM | POA: Diagnosis not present

## 2021-05-19 DIAGNOSIS — L409 Psoriasis, unspecified: Secondary | ICD-10-CM | POA: Diagnosis not present

## 2021-05-19 DIAGNOSIS — L309 Dermatitis, unspecified: Secondary | ICD-10-CM | POA: Diagnosis not present

## 2021-05-19 DIAGNOSIS — M1991 Primary osteoarthritis, unspecified site: Secondary | ICD-10-CM | POA: Diagnosis not present

## 2021-05-19 DIAGNOSIS — Z79899 Other long term (current) drug therapy: Secondary | ICD-10-CM | POA: Diagnosis not present

## 2021-05-19 DIAGNOSIS — Z6821 Body mass index (BMI) 21.0-21.9, adult: Secondary | ICD-10-CM | POA: Diagnosis not present

## 2021-05-25 ENCOUNTER — Other Ambulatory Visit: Payer: Self-pay | Admitting: Internal Medicine

## 2021-05-27 NOTE — Progress Notes (Signed)
Carelink Summary Report / Loop Recorder 

## 2021-06-15 LAB — CUP PACEART REMOTE DEVICE CHECK
Date Time Interrogation Session: 20230606004141
Implantable Pulse Generator Implant Date: 20200710

## 2021-06-16 ENCOUNTER — Ambulatory Visit (INDEPENDENT_AMBULATORY_CARE_PROVIDER_SITE_OTHER): Payer: Medicare Other

## 2021-06-16 DIAGNOSIS — I63431 Cerebral infarction due to embolism of right posterior cerebral artery: Secondary | ICD-10-CM

## 2021-07-10 NOTE — Progress Notes (Signed)
Carelink Summary Report / Loop Recorder 

## 2021-07-13 LAB — CUP PACEART REMOTE DEVICE CHECK
Date Time Interrogation Session: 20230709004314
Implantable Pulse Generator Implant Date: 20200710

## 2021-07-21 ENCOUNTER — Ambulatory Visit (INDEPENDENT_AMBULATORY_CARE_PROVIDER_SITE_OTHER): Payer: Medicare Other

## 2021-07-21 DIAGNOSIS — I63431 Cerebral infarction due to embolism of right posterior cerebral artery: Secondary | ICD-10-CM

## 2021-08-18 ENCOUNTER — Other Ambulatory Visit: Payer: Self-pay | Admitting: Neurology

## 2021-08-18 DIAGNOSIS — I639 Cerebral infarction, unspecified: Secondary | ICD-10-CM

## 2021-08-23 ENCOUNTER — Other Ambulatory Visit: Payer: Self-pay | Admitting: Neurology

## 2021-08-23 DIAGNOSIS — I639 Cerebral infarction, unspecified: Secondary | ICD-10-CM

## 2021-08-23 MED ORDER — CLOPIDOGREL BISULFATE 75 MG PO TABS: 75.0000 mg | ORAL_TABLET | Freq: Every day | ORAL | 4 refills | Status: DC

## 2021-08-25 ENCOUNTER — Ambulatory Visit (INDEPENDENT_AMBULATORY_CARE_PROVIDER_SITE_OTHER): Payer: Medicare Other

## 2021-08-25 DIAGNOSIS — I63431 Cerebral infarction due to embolism of right posterior cerebral artery: Secondary | ICD-10-CM

## 2021-08-25 NOTE — Progress Notes (Signed)
Carelink Summary Report / Loop Recorder 

## 2021-08-26 LAB — CUP PACEART REMOTE DEVICE CHECK
Date Time Interrogation Session: 20230820231546
Implantable Pulse Generator Implant Date: 20200710

## 2021-09-01 ENCOUNTER — Other Ambulatory Visit: Payer: Medicare Other

## 2021-09-01 DIAGNOSIS — E039 Hypothyroidism, unspecified: Secondary | ICD-10-CM | POA: Diagnosis not present

## 2021-09-01 DIAGNOSIS — R7989 Other specified abnormal findings of blood chemistry: Secondary | ICD-10-CM | POA: Diagnosis not present

## 2021-09-01 DIAGNOSIS — Z125 Encounter for screening for malignant neoplasm of prostate: Secondary | ICD-10-CM

## 2021-09-01 DIAGNOSIS — I10 Essential (primary) hypertension: Secondary | ICD-10-CM | POA: Diagnosis not present

## 2021-09-01 DIAGNOSIS — E7800 Pure hypercholesterolemia, unspecified: Secondary | ICD-10-CM | POA: Diagnosis not present

## 2021-09-01 DIAGNOSIS — Z872 Personal history of diseases of the skin and subcutaneous tissue: Secondary | ICD-10-CM

## 2021-09-01 DIAGNOSIS — Z7901 Long term (current) use of anticoagulants: Secondary | ICD-10-CM

## 2021-09-02 LAB — COMPLETE METABOLIC PANEL WITH GFR
AG Ratio: 1.7 (calc) (ref 1.0–2.5)
ALT: 13 U/L (ref 9–46)
AST: 13 U/L (ref 10–35)
Albumin: 4.3 g/dL (ref 3.6–5.1)
Alkaline phosphatase (APISO): 58 U/L (ref 35–144)
BUN: 15 mg/dL (ref 7–25)
CO2: 24 mmol/L (ref 20–32)
Calcium: 9.6 mg/dL (ref 8.6–10.3)
Chloride: 104 mmol/L (ref 98–110)
Creat: 1.2 mg/dL (ref 0.70–1.35)
Globulin: 2.6 g/dL (calc) (ref 1.9–3.7)
Glucose, Bld: 98 mg/dL (ref 65–99)
Potassium: 4.5 mmol/L (ref 3.5–5.3)
Sodium: 139 mmol/L (ref 135–146)
Total Bilirubin: 1.1 mg/dL (ref 0.2–1.2)
Total Protein: 6.9 g/dL (ref 6.1–8.1)
eGFR: 66 mL/min/{1.73_m2} (ref >=60)

## 2021-09-02 LAB — PSA: PSA: 1.15 ng/mL (ref <=4.00)

## 2021-09-02 LAB — CBC WITH DIFFERENTIAL/PLATELET
Absolute Monocytes: 742 cells/uL (ref 200–950)
Basophils Absolute: 83 cells/uL (ref 0–200)
Basophils Relative: 1.3 %
Eosinophils Absolute: 122 cells/uL (ref 15–500)
Eosinophils Relative: 1.9 %
HCT: 47.1 % (ref 38.5–50.0)
Hemoglobin: 15.8 g/dL (ref 13.2–17.1)
Lymphs Abs: 1549 cells/uL (ref 850–3900)
MCH: 31.9 pg (ref 27.0–33.0)
MCHC: 33.5 g/dL (ref 32.0–36.0)
MCV: 95 fL (ref 80.0–100.0)
MPV: 10.6 fL (ref 7.5–12.5)
Monocytes Relative: 11.6 %
Neutro Abs: 3904 cells/uL (ref 1500–7800)
Neutrophils Relative %: 61 %
Platelets: 270 10*3/uL (ref 140–400)
RBC: 4.96 10*6/uL (ref 4.20–5.80)
RDW: 13.4 % (ref 11.0–15.0)
Total Lymphocyte: 24.2 %
WBC: 6.4 10*3/uL (ref 3.8–10.8)

## 2021-09-02 LAB — LIPID PANEL
Cholesterol: 202 mg/dL — ABNORMAL HIGH (ref <200)
HDL: 80 mg/dL (ref >=40)
LDL Cholesterol (Calc): 102 mg/dL (calc) — ABNORMAL HIGH
Non-HDL Cholesterol (Calc): 122 mg/dL (calc) (ref <130)
Total CHOL/HDL Ratio: 2.5 (calc) (ref <5.0)
Triglycerides: 103 mg/dL (ref <150)

## 2021-09-02 LAB — TSH: TSH: 1.29 mIU/L (ref 0.40–4.50)

## 2021-09-15 ENCOUNTER — Encounter: Payer: Self-pay | Admitting: Internal Medicine

## 2021-09-15 ENCOUNTER — Ambulatory Visit (INDEPENDENT_AMBULATORY_CARE_PROVIDER_SITE_OTHER): Payer: Medicare Other | Admitting: Internal Medicine

## 2021-09-15 VITALS — BP 110/68 | HR 96 | Temp 98.2°F | Ht 64.0 in | Wt 129.1 lb

## 2021-09-15 DIAGNOSIS — Z87891 Personal history of nicotine dependence: Secondary | ICD-10-CM

## 2021-09-15 DIAGNOSIS — I10 Essential (primary) hypertension: Secondary | ICD-10-CM | POA: Diagnosis not present

## 2021-09-15 DIAGNOSIS — Z7901 Long term (current) use of anticoagulants: Secondary | ICD-10-CM

## 2021-09-15 DIAGNOSIS — L4050 Arthropathic psoriasis, unspecified: Secondary | ICD-10-CM | POA: Diagnosis not present

## 2021-09-15 DIAGNOSIS — E039 Hypothyroidism, unspecified: Secondary | ICD-10-CM | POA: Diagnosis not present

## 2021-09-15 DIAGNOSIS — Z872 Personal history of diseases of the skin and subcutaneous tissue: Secondary | ICD-10-CM | POA: Diagnosis not present

## 2021-09-15 DIAGNOSIS — E7800 Pure hypercholesterolemia, unspecified: Secondary | ICD-10-CM | POA: Diagnosis not present

## 2021-09-15 MED ORDER — AMOXICILLIN-POT CLAVULANATE 875-125 MG PO TABS: 1.0000 | ORAL_TABLET | Freq: Two times a day (BID) | ORAL | 1 refills | Status: DC

## 2021-09-15 NOTE — Patient Instructions (Signed)
Augmentin refilled for skin infection.  Continue current medications.  Vaccines discussed.  Colonoscopy referral will be made to [facility].  Return in 6 months.

## 2021-09-15 NOTE — Progress Notes (Signed)
   Subjective:    Patient ID: Sean Weaver, male    DOB: 12-02-1955, 66 y.o.   MRN: [id number]  HPI   Pleasant 66 year old Male in for 6 month follow up visit today.  He has a history of hypothyroidism and is on thyroid replacement medication.  Became hypertensive in early 2000 and was started on Lipitor in 1999 for hyperlipidemia.  History of Psoriasis and Psoriatic arthritis treated by Rheumatologist with Humira.  He had endoscopy in 1996 for epigastric discomfort by Dr. Amedeo Plenty showing diffuse gastritis and duodenitis which improved with Prilosec.  Subsequently had colonoscopy by Dr. Olevia Perches in 2007 and adenomatous polyps were noted.  Referral will be made back to [facility] for screening colonoscopy.  He will get tetanus immunization updated at pharmacy.  Vaccines discussed for the Fall including new COVID booster.  History of monocular vision loss.  He saw Dr. Jaynee Eagles.  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  Carotids were patent on MRI a loop recorder was placed in 2020 and he had a Cardiology evaluation.  He is followed by Dr. Wilhemina Bonito, Dermatologist as well.  He has smoked for many years.  We discussed today coronary calcium scoring which also will get a CT of his lungs which could screen for lung cancer in addition to coronary disease.  He will consider it.       Review of Systems see above     Objective:   Physical Exam  BP 110/68 BMI 22.16 pulse 96 T 98.2 degrees Weight 129 pounds BMI 22.16  Neck is supple without JVD, thyromegaly, or carotid bruits.  Chest clear.  Cardiac exam: Regular rate and rhythm without ectopy.  No lower extremity pitting edema.      Assessment & Plan:   Has skin lesion on hand that appears to have an early secondary infection. Augmentin refilled for this issue.   Psoriatic arthritis seen at [place] on DMARD  Psoriasis followed by [facility]  History of  smoking- needs to quit  Discussed coronary calcium scoring which would also give CT of the lung with history of smoking- he will consider  Hypothyroidism treated with thyroid replacement medication and T SH is stable.  Plan: Continue current medications for hypertension hyperlipidemia and hypothyroidism.  Continue Humira for psoriatic arthritis per rheumatology.  Continue Plavix.  Continue Wellbutrin.  This may help with smoking cessation as well.  Continue Altace for hypertension and rosuvastatin for hyperlipidemia.  Referral made to [place] for colonoscopy.

## 2021-09-20 NOTE — Progress Notes (Signed)
Carelink Summary Report / Loop Recorder 

## 2021-09-22 ENCOUNTER — Encounter: Payer: Self-pay | Admitting: Internal Medicine

## 2021-09-22 NOTE — Telephone Encounter (Signed)
Noted  

## 2021-09-29 ENCOUNTER — Ambulatory Visit (INDEPENDENT_AMBULATORY_CARE_PROVIDER_SITE_OTHER): Payer: Medicare Other

## 2021-09-29 DIAGNOSIS — I63431 Cerebral infarction due to embolism of right posterior cerebral artery: Secondary | ICD-10-CM | POA: Diagnosis not present

## 2021-09-29 LAB — CUP PACEART REMOTE DEVICE CHECK
Date Time Interrogation Session: 20230922231047
Implantable Pulse Generator Implant Date: 20200710

## 2021-10-09 NOTE — Progress Notes (Signed)
Carelink Summary Report / Loop Recorder 

## 2021-10-31 LAB — CUP PACEART REMOTE DEVICE CHECK
Date Time Interrogation Session: 20231025231824
Implantable Pulse Generator Implant Date: 20200710

## 2021-11-03 ENCOUNTER — Ambulatory Visit (INDEPENDENT_AMBULATORY_CARE_PROVIDER_SITE_OTHER): Payer: Medicare Other

## 2021-11-03 DIAGNOSIS — I63431 Cerebral infarction due to embolism of right posterior cerebral artery: Secondary | ICD-10-CM | POA: Diagnosis not present

## 2021-11-10 DIAGNOSIS — L409 Psoriasis, unspecified: Secondary | ICD-10-CM | POA: Diagnosis not present

## 2021-11-10 DIAGNOSIS — R5383 Other fatigue: Secondary | ICD-10-CM | POA: Diagnosis not present

## 2021-11-10 DIAGNOSIS — L4050 Arthropathic psoriasis, unspecified: Secondary | ICD-10-CM | POA: Diagnosis not present

## 2021-11-10 DIAGNOSIS — Z6820 Body mass index (BMI) 20.0-20.9, adult: Secondary | ICD-10-CM | POA: Diagnosis not present

## 2021-11-10 DIAGNOSIS — M1991 Primary osteoarthritis, unspecified site: Secondary | ICD-10-CM | POA: Diagnosis not present

## 2021-11-10 DIAGNOSIS — Z79899 Other long term (current) drug therapy: Secondary | ICD-10-CM | POA: Diagnosis not present

## 2021-11-29 ENCOUNTER — Other Ambulatory Visit: Payer: Self-pay | Admitting: Internal Medicine

## 2021-12-01 DIAGNOSIS — R519 Headache, unspecified: Secondary | ICD-10-CM | POA: Diagnosis not present

## 2021-12-01 DIAGNOSIS — D3132 Benign neoplasm of left choroid: Secondary | ICD-10-CM | POA: Diagnosis not present

## 2021-12-01 DIAGNOSIS — H2513 Age-related nuclear cataract, bilateral: Secondary | ICD-10-CM | POA: Diagnosis not present

## 2021-12-01 DIAGNOSIS — H0102A Squamous blepharitis right eye, upper and lower eyelids: Secondary | ICD-10-CM | POA: Diagnosis not present

## 2021-12-02 NOTE — Progress Notes (Signed)
Carelink Summary Report / Loop Recorder 

## 2021-12-08 ENCOUNTER — Ambulatory Visit (INDEPENDENT_AMBULATORY_CARE_PROVIDER_SITE_OTHER): Payer: Medicare Other

## 2021-12-08 DIAGNOSIS — I63431 Cerebral infarction due to embolism of right posterior cerebral artery: Secondary | ICD-10-CM | POA: Diagnosis not present

## 2021-12-08 LAB — CUP PACEART REMOTE DEVICE CHECK
Date Time Interrogation Session: 20231203232638
Implantable Pulse Generator Implant Date: 20200710

## 2021-12-15 DIAGNOSIS — R7982 Elevated C-reactive protein (CRP): Secondary | ICD-10-CM | POA: Diagnosis not present

## 2022-01-12 ENCOUNTER — Ambulatory Visit (INDEPENDENT_AMBULATORY_CARE_PROVIDER_SITE_OTHER): Payer: Medicare Other

## 2022-01-12 DIAGNOSIS — I63431 Cerebral infarction due to embolism of right posterior cerebral artery: Secondary | ICD-10-CM

## 2022-01-12 LAB — CUP PACEART REMOTE DEVICE CHECK
Date Time Interrogation Session: 20240107231344
Implantable Pulse Generator Implant Date: 20200710

## 2022-01-14 NOTE — Progress Notes (Signed)
Carelink Summary Report / Loop Recorder 

## 2022-02-11 ENCOUNTER — Telehealth: Payer: Self-pay

## 2022-02-11 NOTE — Telephone Encounter (Signed)
LVM for patient to call device clinic back, patient has been made inactive in paceart/apts scheduled.

## 2022-02-11 NOTE — Telephone Encounter (Signed)
Spoke with patient, patient is electing to leave loop recorder in for now will call back if he decides to have it taken out

## 2022-02-13 NOTE — Progress Notes (Signed)
Carelink Summary Report / Loop Recorder 

## 2022-03-05 ENCOUNTER — Other Ambulatory Visit: Payer: Self-pay | Admitting: Internal Medicine

## 2022-03-16 ENCOUNTER — Other Ambulatory Visit: Payer: Medicare Other

## 2022-03-16 DIAGNOSIS — Z125 Encounter for screening for malignant neoplasm of prostate: Secondary | ICD-10-CM

## 2022-03-16 DIAGNOSIS — E7800 Pure hypercholesterolemia, unspecified: Secondary | ICD-10-CM

## 2022-03-16 DIAGNOSIS — E039 Hypothyroidism, unspecified: Secondary | ICD-10-CM | POA: Diagnosis not present

## 2022-03-16 DIAGNOSIS — I10 Essential (primary) hypertension: Secondary | ICD-10-CM

## 2022-03-16 LAB — COMPLETE METABOLIC PANEL WITH GFR
AG Ratio: 1.9 (calc) (ref 1.0–2.5)
ALT: 15 U/L (ref 9–46)
AST: 17 U/L (ref 10–35)
Albumin: 4.7 g/dL (ref 3.6–5.1)
Alkaline phosphatase (APISO): 65 U/L (ref 35–144)
BUN: 19 mg/dL (ref 7–25)
CO2: 27 mmol/L (ref 20–32)
Calcium: 9.7 mg/dL (ref 8.6–10.3)
Chloride: 104 mmol/L (ref 98–110)
Creat: 1.32 mg/dL (ref 0.70–1.35)
Globulin: 2.5 g/dL (calc) (ref 1.9–3.7)
Glucose, Bld: 98 mg/dL (ref 65–99)
Potassium: 5.1 mmol/L (ref 3.5–5.3)
Sodium: 140 mmol/L (ref 135–146)
Total Bilirubin: 0.6 mg/dL (ref 0.2–1.2)
Total Protein: 7.2 g/dL (ref 6.1–8.1)
eGFR: 59 mL/min/{1.73_m2} — ABNORMAL LOW (ref >=60)

## 2022-03-16 LAB — CBC WITH DIFFERENTIAL/PLATELET
Absolute Monocytes: 730 cells/uL (ref 200–950)
Basophils Absolute: 70 cells/uL (ref 0–200)
Basophils Relative: 1.1 %
Eosinophils Absolute: 109 cells/uL (ref 15–500)
Eosinophils Relative: 1.7 %
HCT: 46.2 % (ref 38.5–50.0)
Hemoglobin: 15.7 g/dL (ref 13.2–17.1)
Lymphs Abs: 1658 cells/uL (ref 850–3900)
MCH: 31.4 pg (ref 27.0–33.0)
MCHC: 34 g/dL (ref 32.0–36.0)
MCV: 92.4 fL (ref 80.0–100.0)
MPV: 10.6 fL (ref 7.5–12.5)
Monocytes Relative: 11.4 %
Neutro Abs: 3834 cells/uL (ref 1500–7800)
Neutrophils Relative %: 59.9 %
Platelets: 284 10*3/uL (ref 140–400)
RBC: 5 10*6/uL (ref 4.20–5.80)
RDW: 14.3 % (ref 11.0–15.0)
Total Lymphocyte: 25.9 %
WBC: 6.4 10*3/uL (ref 3.8–10.8)

## 2022-03-16 LAB — PSA: PSA: 1.02 ng/mL (ref <=4.00)

## 2022-03-16 LAB — LIPID PANEL
Cholesterol: 194 mg/dL (ref <200)
HDL: 84 mg/dL (ref >=40)
LDL Cholesterol (Calc): 96 mg/dL (calc)
Non-HDL Cholesterol (Calc): 110 mg/dL (calc) (ref <130)
Total CHOL/HDL Ratio: 2.3 (calc) (ref <5.0)
Triglycerides: 54 mg/dL (ref <150)

## 2022-03-16 LAB — TSH: TSH: 0.97 mIU/L (ref 0.40–4.50)

## 2022-03-16 NOTE — Progress Notes (Signed)
Annual Wellness Visit    Patient Care Team: Elby Showers, MD as PCP - General (Internal Medicine)  Visit Date: 03/23/22   Chief Complaint  Patient presents with   Medicare Wellness    Subjective:   Patient: Sean Weaver, Male    DOB: 1955-02-11, 67 y.o.   MRN: [id number]  Sean Weaver is a 67 y.o. Male who presents today for his Annual Wellness Visit. He has a history of GERD, hyperlipidemia, hypertension, hypothyroidism, psoriatic arthritis, stroke, tubular adenoma.  Loop recorder is not working but he is electing to leave it in for now per cardiology.  History of psoriatic arthritis treated with Humira 40 mg every other week.  History of stroke treated with Plavix 75 mg daily.  History of hypertension treated with Altace 10 mg daily. Blood pressure normal today at 136/78.  History of hyperlipidemia treated with Crestor 20 mg daily. Lipid panel normal on 03/16/22.  History of hypothyroidism treated with Synthroid 100 mcg daily. TSH normal at 0.97 on 03/16/22.  History of depression treated with Wellbutrin XL 150 mg daily.  History of erectile dysfunction treated with Revatio up to five tablets daily as needed.  Serum creatinine elevated at 1.32 on 03/16/22, up from 1.09 on 03/03/21.  Denies vomiting, fever, chills.  PSA normal at 1.15 on 09/01/21.  Colonoscopy last completed 2007 and adenomatous polyps noted.   Past Medical History:  Diagnosis Date   GERD (gastroesophageal reflux disease)    resolved per pt report   Hyperlipidemia    Hypertension    Hypothyroidism    Psoriatic arthritis ([place])    Stroke (cerebrum) ([place])    Tubular adenoma      Family History  Problem Relation Age of Onset   Cancer Mother        ovarian   Cancer Father        prostate    Kidney failure Father    Hypertension Sister    Non-Hodgkin's lymphoma Sister    Migraines Neg Hx    Headache Neg Hx      Social History   Social History Narrative   Lives at home  alone  In [place].   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  HENT:  Negative for hearing loss, sinus pain and sore throat.   Respiratory:  Negative for cough and hemoptysis.   Cardiovascular:  Negative for chest pain, palpitations, leg swelling and PND.  Gastrointestinal:  Negative for abdominal pain, constipation, diarrhea, heartburn, nausea and vomiting.  Genitourinary:  Negative for dysuria, frequency and urgency.  Musculoskeletal:  Negative for back pain, myalgias and neck pain.  Skin:  Negative for itching and rash.  Neurological:  Negative for dizziness, tingling, seizures and headaches.  Endo/Heme/Allergies:  Negative for polydipsia.  Psychiatric/Behavioral:  Negative for depression. The patient is not nervous/anxious.       Objective:   Vitals: BP 136/78   Pulse 91   Temp 98.1 F (36.7 C) (Tympanic)   Ht 5\' 6"  (1.676 m)   Wt 131 lb 1.9 oz (59.5 kg)   SpO2 99%   BMI 21.16 kg/m   Physical Exam Vitals and nursing note reviewed.  Constitutional:      General: He is awake. He is not in acute distress.    Appearance: Normal appearance. He is not ill-appearing or toxic-appearing.  HENT:     Head: Normocephalic and atraumatic.     Right Ear: Tympanic membrane, ear canal and external ear normal.     Left Ear: Tympanic membrane, ear canal and external ear normal.     Mouth/Throat:     Pharynx: Oropharynx is clear.  Eyes:     Extraocular Movements: Extraocular movements intact.     Pupils: Pupils are equal, round, and reactive to light.  Neck:     Thyroid: No thyroid mass, thyromegaly or thyroid tenderness.     Vascular: No carotid bruit.  Cardiovascular:     Rate and Rhythm: Normal rate and regular rhythm. No extrasystoles are present.    Pulses: Normal pulses.          Dorsalis pedis pulses are 2+ on the right side and 2+ on the  left side.       Posterior tibial pulses are 2+ on the right side and 2+ on the left side.     Heart sounds: Normal heart sounds. No murmur heard.    No friction rub. No gallop.  Pulmonary:     Effort: Pulmonary effort is normal.     Breath sounds: Normal breath sounds. No decreased breath sounds, wheezing, rhonchi or rales.  Chest:     Chest wall: No mass.  Abdominal:     Palpations: Abdomen is soft.     Tenderness: There is abdominal tenderness (Light) in the left lower quadrant.     Hernia: No hernia is present.     Comments: Light tenderness in LLQ without rebound tenderness.  Genitourinary:    Prostate: Not enlarged and no nodules present.     Comments: Prostate slightly boggy. Musculoskeletal:     Cervical back: Normal range of motion.     Right lower leg: No edema.     Left lower leg: No edema.  Lymphadenopathy:     Cervical: No cervical adenopathy.     Upper Body:     Right upper body: No supraclavicular adenopathy.     Left upper body: No supraclavicular adenopathy.  Skin:    General: Skin is warm and dry.  Neurological:     General: No focal deficit present.     Mental Status: He is alert and oriented to person, place, and time. Mental status is at baseline.     Cranial Nerves: Cranial nerves 2-12 are intact.     Sensory: Sensation is intact.     Motor: Motor function is intact.     Coordination: Coordination is intact.     Gait: Gait is intact.     Deep Tendon Reflexes: Reflexes are normal and symmetric.  Psychiatric:        Attention and Perception: Attention normal.        Mood and Affect: Mood normal.        Speech: Speech normal.        Behavior: Behavior normal. Behavior is cooperative.        Thought Content: Thought content normal.        Cognition and Memory: Cognition and memory normal.        Judgment: Judgment normal.      Most recent functional status assessment:    03/23/2022   10:59 AM  In your present state of health, do you have any  difficulty performing the following activities:  Hearing? 0  Vision? 0  Difficulty concentrating or making decisions? 0  Walking or climbing stairs? 0  Dressing or bathing? 0  Doing  errands, shopping? 0  Preparing Food and eating ? N  Using the Toilet? N  In the past six months, have you accidently leaked urine? N  Do you have problems with loss of bowel control? N  Managing your Medications? N  Managing your Finances? N  Housekeeping or managing your Housekeeping? N   Most recent fall risk assessment:    03/23/2022   10:59 AM  Fall Risk   Falls in the past year? 0  Number falls in past yr: 0  Injury with Fall? 0  Risk for fall due to : No Fall Risks  Follow up Falls prevention discussed    Most recent depression screenings:    03/23/2022   10:59 AM 09/15/2021    9:22 AM  PHQ 2/9 Scores  PHQ - 2 Score 0 0   Most recent cognitive screening:    03/23/2022   11:00 AM  6CIT Screen  What Year? 0 points  What month? 0 points  What time? 0 points  Count back from 20 0 points  Months in reverse 0 points  Repeat phrase 0 points  Total Score 0 points     Results:   Studies obtained and personally reviewed by me:  Colonoscopy last completed 2007 and adenomatous polyps noted.   Labs:       Component Value Date/Time   NA 140 03/16/2022 1017   NA 140 02/21/2018 0855   K 5.1 03/16/2022 1017   CL 104 03/16/2022 1017   CO2 27 03/16/2022 1017   GLUCOSE 98 03/16/2022 1017   BUN 19 03/16/2022 1017   BUN 17 02/21/2018 0855   CREATININE 1.32 03/16/2022 1017   CALCIUM 9.7 03/16/2022 1017   PROT 7.2 03/16/2022 1017   ALBUMIN 3.9 06/02/2016 1033   AST 17 03/16/2022 1017   ALT 15 03/16/2022 1017   ALKPHOS 89 06/02/2016 1033   BILITOT 0.6 03/16/2022 1017   GFRNONAA 58 (L) 02/19/2020 0918   GFRAA 67 02/19/2020 0918     Lab Results  Component Value Date   WBC 6.4 03/16/2022   HGB 15.7 03/16/2022   HCT 46.2 03/16/2022   MCV 92.4 03/16/2022   PLT 284 03/16/2022     Lab Results  Component Value Date   CHOL 194 03/16/2022   HDL 84 03/16/2022   LDLCALC 96 03/16/2022   TRIG 54 03/16/2022   CHOLHDL 2.3 03/16/2022    Lab Results  Component Value Date   HGBA1C 5.4 08/12/2020     Lab Results  Component Value Date   TSH 0.97 03/16/2022     Lab Results  Component Value Date   PSA 1.02 03/16/2022   PSA 1.15 09/01/2021   PSA 1.29 03/03/2021    Assessment & Plan:   Psoriatic arthritis: treated with Humira 40 mg every other week.Followed by Dr. 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 Had loop recorder inserted several years ago. Cardiology says no need to monitor any longer. He could have it removed but elected to leave in place for now. Presented with monocular vision loss. Seen by Dr. Jaynee Eagles. 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 Carotids were patent on MRI. Loop recorder placed in 2020. Was on Plavix for some time but this has been discontinued. Loop recorder no longer being monitored.  Hypertension: treated with Altace 10 mg daily. Blood pressure normal today at 136/78.  Hyperlipidemia: treated with Crestor 20 mg daily. Lipid panel normal on 03/16/22.  Hypothyroidism: treated with Synthroid 100 mcg daily. TSH normal at 0.97 on 03/16/22.  Depression: treated with Wellbutrin XL 150 mg daily.  Erectile dysfunction: treated with generic Cialis up to five tablets daily as needed. Refilled.  Elevated serum creatinine: serum creatinine elevated at 1.32 on 03/16/22, up from 1.09 on 03/03/21. Likely due to volume depletion when fasting. Would like to recheck when well hydrated.  Bilateral cataracts being followed  Colonoscopy: last completed 2007 and adenomatous polyps noted.Needs repeat study.Hx of adenomatous poylps  Fecal occult blood test negative.  Vaccine  Counseling: Discussed vaccines.   Return in 3 months for creatinine check, no office visit.     Annual wellness visit done today including the all of the following: Reviewed patient's Family Medical History Reviewed and updated list of patient's medical providers Assessment of cognitive impairment was done Assessed patient's functional ability Established a written schedule for health screening [place] Completed and Reviewed  Discussed health benefits of physical activity, and encouraged him to engage in regular exercise appropriate for his age and condition.        I,Alexander Ruley,acting as a Education administrator for Elby Showers, MD.,have documented all relevant documentation on the behalf of Elby Showers, MD,as directed by  Elby Showers, MD while in the presence of Elby Showers, MD.   I, Elby Showers, MD, have reviewed all documentation for this visit. The documentation on 03/30/22 for the exam, diagnosis, procedures, and orders are all accurate and complete.

## 2022-03-23 ENCOUNTER — Ambulatory Visit (INDEPENDENT_AMBULATORY_CARE_PROVIDER_SITE_OTHER): Payer: Medicare Other | Admitting: Internal Medicine

## 2022-03-23 ENCOUNTER — Encounter: Payer: Self-pay | Admitting: Internal Medicine

## 2022-03-23 VITALS — BP 136/78 | HR 91 | Temp 98.1°F | Ht 66.0 in | Wt 131.1 lb

## 2022-03-23 DIAGNOSIS — Z87891 Personal history of nicotine dependence: Secondary | ICD-10-CM

## 2022-03-23 DIAGNOSIS — L4050 Arthropathic psoriasis, unspecified: Secondary | ICD-10-CM | POA: Diagnosis not present

## 2022-03-23 DIAGNOSIS — I10 Essential (primary) hypertension: Secondary | ICD-10-CM

## 2022-03-23 DIAGNOSIS — Z0000 Encounter for general adult medical examination without abnormal findings: Secondary | ICD-10-CM

## 2022-03-23 DIAGNOSIS — Z86010 Personal history of colonic polyps: Secondary | ICD-10-CM

## 2022-03-23 DIAGNOSIS — E039 Hypothyroidism, unspecified: Secondary | ICD-10-CM | POA: Diagnosis not present

## 2022-03-23 DIAGNOSIS — Z8673 Personal history of transient ischemic attack (TIA), and cerebral infarction without residual deficits: Secondary | ICD-10-CM

## 2022-03-23 DIAGNOSIS — Z872 Personal history of diseases of the skin and subcutaneous tissue: Secondary | ICD-10-CM

## 2022-03-23 DIAGNOSIS — E7800 Pure hypercholesterolemia, unspecified: Secondary | ICD-10-CM | POA: Diagnosis not present

## 2022-03-23 LAB — HEMOCCULT GUIAC POC 1CARD (OFFICE): Fecal Occult Blood, POC: NEGATIVE

## 2022-03-23 LAB — POCT URINALYSIS DIPSTICK
Bilirubin, UA: NEGATIVE
Blood, UA: NEGATIVE
Glucose, UA: NEGATIVE
Ketones, UA: NEGATIVE
Leukocytes, UA: NEGATIVE
Nitrite, UA: NEGATIVE
Protein, UA: NEGATIVE
Spec Grav, UA: 1.01 (ref 1.010–1.025)
Urobilinogen, UA: 0.2 E.U./dL
pH, UA: 7 (ref 5.0–8.0)

## 2022-03-23 MED ORDER — SILDENAFIL CITRATE 20 MG PO TABS: ORAL_TABLET | ORAL | 99 refills | Status: DC

## 2022-03-30 NOTE — Patient Instructions (Addendum)
It was a pleasure to see you today. Labs are stable. Please have colonoscopy in the near future. Please consider the new pneumococcal 20 vaccine. Patient plans to return June 2024 when well hydrated to look check BUN and creatinine which were elevated with recent fasting labs. Patient does not have to fasting for the lab in June but needs to drink 2-3 glasses of water prior to the appt.

## 2022-04-19 ENCOUNTER — Other Ambulatory Visit: Payer: Self-pay | Admitting: Internal Medicine

## 2022-04-23 ENCOUNTER — Other Ambulatory Visit: Payer: Self-pay | Admitting: Neurology

## 2022-04-23 MED ORDER — AMOXICILLIN-POT CLAVULANATE 875-125 MG PO TABS: 1.0000 | ORAL_TABLET | Freq: Two times a day (BID) | ORAL | 0 refills | Status: DC

## 2022-05-10 ENCOUNTER — Other Ambulatory Visit: Payer: Self-pay | Admitting: Neurology

## 2022-05-10 MED ORDER — TRIAMCINOLONE ACETONIDE 0.1 % EX CREA: TOPICAL_CREAM | Freq: Two times a day (BID) | CUTANEOUS | 6 refills | Status: AC

## 2022-05-10 MED ORDER — CEPHALEXIN 500 MG PO CAPS: 500.0000 mg | ORAL_CAPSULE | Freq: Four times a day (QID) | ORAL | 0 refills | Status: DC

## 2022-05-11 DIAGNOSIS — Z79899 Other long term (current) drug therapy: Secondary | ICD-10-CM | POA: Diagnosis not present

## 2022-05-11 DIAGNOSIS — L4050 Arthropathic psoriasis, unspecified: Secondary | ICD-10-CM | POA: Diagnosis not present

## 2022-05-11 DIAGNOSIS — R7982 Elevated C-reactive protein (CRP): Secondary | ICD-10-CM | POA: Diagnosis not present

## 2022-05-11 DIAGNOSIS — L409 Psoriasis, unspecified: Secondary | ICD-10-CM | POA: Diagnosis not present

## 2022-05-11 DIAGNOSIS — Z6821 Body mass index (BMI) 21.0-21.9, adult: Secondary | ICD-10-CM | POA: Diagnosis not present

## 2022-05-11 DIAGNOSIS — M1991 Primary osteoarthritis, unspecified site: Secondary | ICD-10-CM | POA: Diagnosis not present

## 2022-05-12 ENCOUNTER — Encounter: Payer: Self-pay | Admitting: Internal Medicine

## 2022-05-31 ENCOUNTER — Other Ambulatory Visit (HOSPITAL_COMMUNITY): Payer: Self-pay

## 2022-05-31 ENCOUNTER — Telehealth: Payer: Self-pay

## 2022-05-31 NOTE — Telephone Encounter (Signed)
Pharmacy Patient Advocate Encounter  Received notification from [facility] that the request for prior authorization for Sildenafil Citrate 20MG  has been denied due to Medicare allows [place] to cover a drug only when it is a Part D drug. A Part D drug is one that is used for a "medically accepted indication." A medically accepted indication means the use is approved by the Food and Drug Administration (FDA).    Please be advised we currently do not have a Pharmacist to review denials, therefore you will need to process appeals accordingly as needed. Thanks for your support at this time.   You may call [phone] or fax [phone], to appeal.  Melanee Spry CPhT Rx Patient Advocate [phone]

## 2022-06-22 ENCOUNTER — Other Ambulatory Visit: Payer: Medicare Other

## 2022-06-22 DIAGNOSIS — R799 Abnormal finding of blood chemistry, unspecified: Secondary | ICD-10-CM

## 2022-06-22 DIAGNOSIS — R7989 Other specified abnormal findings of blood chemistry: Secondary | ICD-10-CM

## 2022-06-22 NOTE — Addendum Note (Signed)
Addended by: Jama Flavors on: 06/22/2022 09:30 AM   Modules accepted: Orders

## 2022-06-23 LAB — BASIC METABOLIC PANEL
BUN: 12 mg/dL (ref 7–25)
CO2: 28 mmol/L (ref 20–32)
Calcium: 9.4 mg/dL (ref 8.6–10.3)
Chloride: 104 mmol/L (ref 98–110)
Creat: 1.14 mg/dL (ref 0.70–1.35)
Glucose, Bld: 97 mg/dL (ref 65–99)
Potassium: 4.4 mmol/L (ref 3.5–5.3)
Sodium: 139 mmol/L (ref 135–146)

## 2022-06-28 ENCOUNTER — Other Ambulatory Visit: Payer: Self-pay | Admitting: Internal Medicine

## 2022-07-20 DIAGNOSIS — L814 Other melanin hyperpigmentation: Secondary | ICD-10-CM | POA: Diagnosis not present

## 2022-07-20 DIAGNOSIS — L821 Other seborrheic keratosis: Secondary | ICD-10-CM | POA: Diagnosis not present

## 2022-07-20 DIAGNOSIS — D1801 Hemangioma of skin and subcutaneous tissue: Secondary | ICD-10-CM | POA: Diagnosis not present

## 2022-07-20 DIAGNOSIS — L309 Dermatitis, unspecified: Secondary | ICD-10-CM | POA: Diagnosis not present

## 2022-07-20 DIAGNOSIS — D692 Other nonthrombocytopenic purpura: Secondary | ICD-10-CM | POA: Diagnosis not present

## 2022-07-20 DIAGNOSIS — D225 Melanocytic nevi of trunk: Secondary | ICD-10-CM | POA: Diagnosis not present

## 2022-07-27 ENCOUNTER — Encounter: Payer: Self-pay | Admitting: Internal Medicine

## 2022-07-27 ENCOUNTER — Ambulatory Visit (INDEPENDENT_AMBULATORY_CARE_PROVIDER_SITE_OTHER): Payer: Medicare Other | Admitting: Internal Medicine

## 2022-07-27 ENCOUNTER — Other Ambulatory Visit: Payer: Self-pay | Admitting: Neurology

## 2022-07-27 ENCOUNTER — Telehealth: Payer: Self-pay | Admitting: Internal Medicine

## 2022-07-27 VITALS — BP 132/78 | HR 84 | Temp 98.6°F | Resp 16

## 2022-07-27 DIAGNOSIS — J069 Acute upper respiratory infection, unspecified: Secondary | ICD-10-CM

## 2022-07-27 DIAGNOSIS — I639 Cerebral infarction, unspecified: Secondary | ICD-10-CM

## 2022-07-27 DIAGNOSIS — H6503 Acute serous otitis media, bilateral: Secondary | ICD-10-CM

## 2022-07-27 MED ORDER — AZITHROMYCIN 250 MG PO TABS: ORAL_TABLET | ORAL | 1 refills | Status: AC

## 2022-07-27 NOTE — Telephone Encounter (Signed)
Sean Weaver [id number]  Molly Maduro called to say that yesterday he started having Left side cheek bone pain, drainage, ears stuffy. He is going to do COVID test and call me back.

## 2022-07-27 NOTE — Patient Instructions (Signed)
We are sorry you are not feeling well.  It was a pleasure to see you today.  Please take Zithromax Z-PAK 2 tabs day 1 followed by 1 tab days 2 through 5.  You have 1 refill on that prescription.  Please rest and stay well-hydrated.  Call if not improving in 5 to 7 days or sooner if worse.

## 2022-07-27 NOTE — Progress Notes (Signed)
   Subjective:    Patient ID: Sean Weaver, male    DOB: Jul 03, 1955, 67 y.o.   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 Yesterday, had left sided pain over left zygnomatic arch, stuffiness in ears and post nasal drainage. Negative home COVID test.   Review of Systems no shaking chills, nausea, vomiting, myalgias.  Right ear has more discomfort than left ear     Objective:   Physical Exam Temperature 98.6 degrees, pulse 84 regular, respiratory rate 16, blood pressure 132/78, pulse oximetry 97% Skin: Warm and dry.  Left TM is not red but slightly dull with splayed light reflex.  Right TM is fuller but not red with splayed light reflex.  Neck is supple.  No adenopathy.  Chest is clear to auscultation without rales or wheezing.       Assessment & Plan:   Acute bilateral otitis media right worse than left  Plan: Zithromax Z-PAK 2 tabs day 1 followed by 1 tab days 2 through 5 with 1 refill.  Rest and stay well-hydrated.  Call if not improving in 5 to 7 days or sooner if worse.

## 2022-07-27 NOTE — Telephone Encounter (Signed)
Appt today

## 2022-07-29 NOTE — Telephone Encounter (Signed)
LVM  pt hasn't been seen since 2020 needing a appointment

## 2022-08-20 ENCOUNTER — Other Ambulatory Visit: Payer: Self-pay | Admitting: Neurology

## 2022-08-20 DIAGNOSIS — I639 Cerebral infarction, unspecified: Secondary | ICD-10-CM

## 2022-08-20 MED ORDER — CLOPIDOGREL BISULFATE 75 MG PO TABS
75.0000 mg | ORAL_TABLET | Freq: Every day | ORAL | 3 refills | Status: DC
Start: 2022-08-20 — End: 2023-06-10

## 2022-08-28 IMAGING — CR DG RIBS 2V*L*
2 series · 2 of 2 positions shown · non-contrast
Comparison: Chest x-ray 04/04/2021.

CLINICAL DATA: Pain after lifting

EXAM:
LEFT RIBS - 2 VIEW

[w ribs ap lower left (1 of 2)]
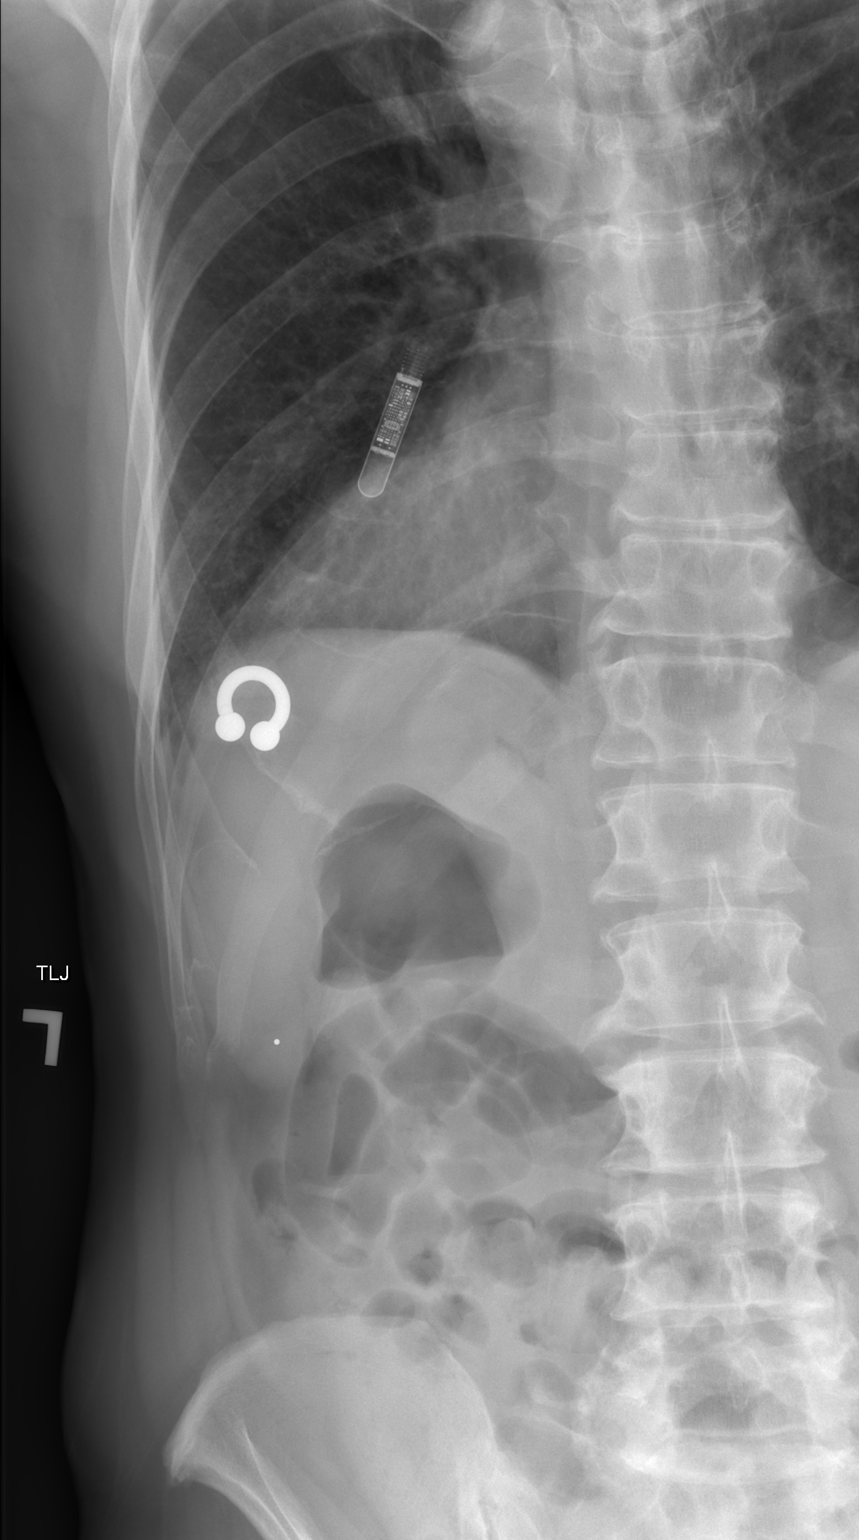

[w ribs ap lower left (2 of 2)]
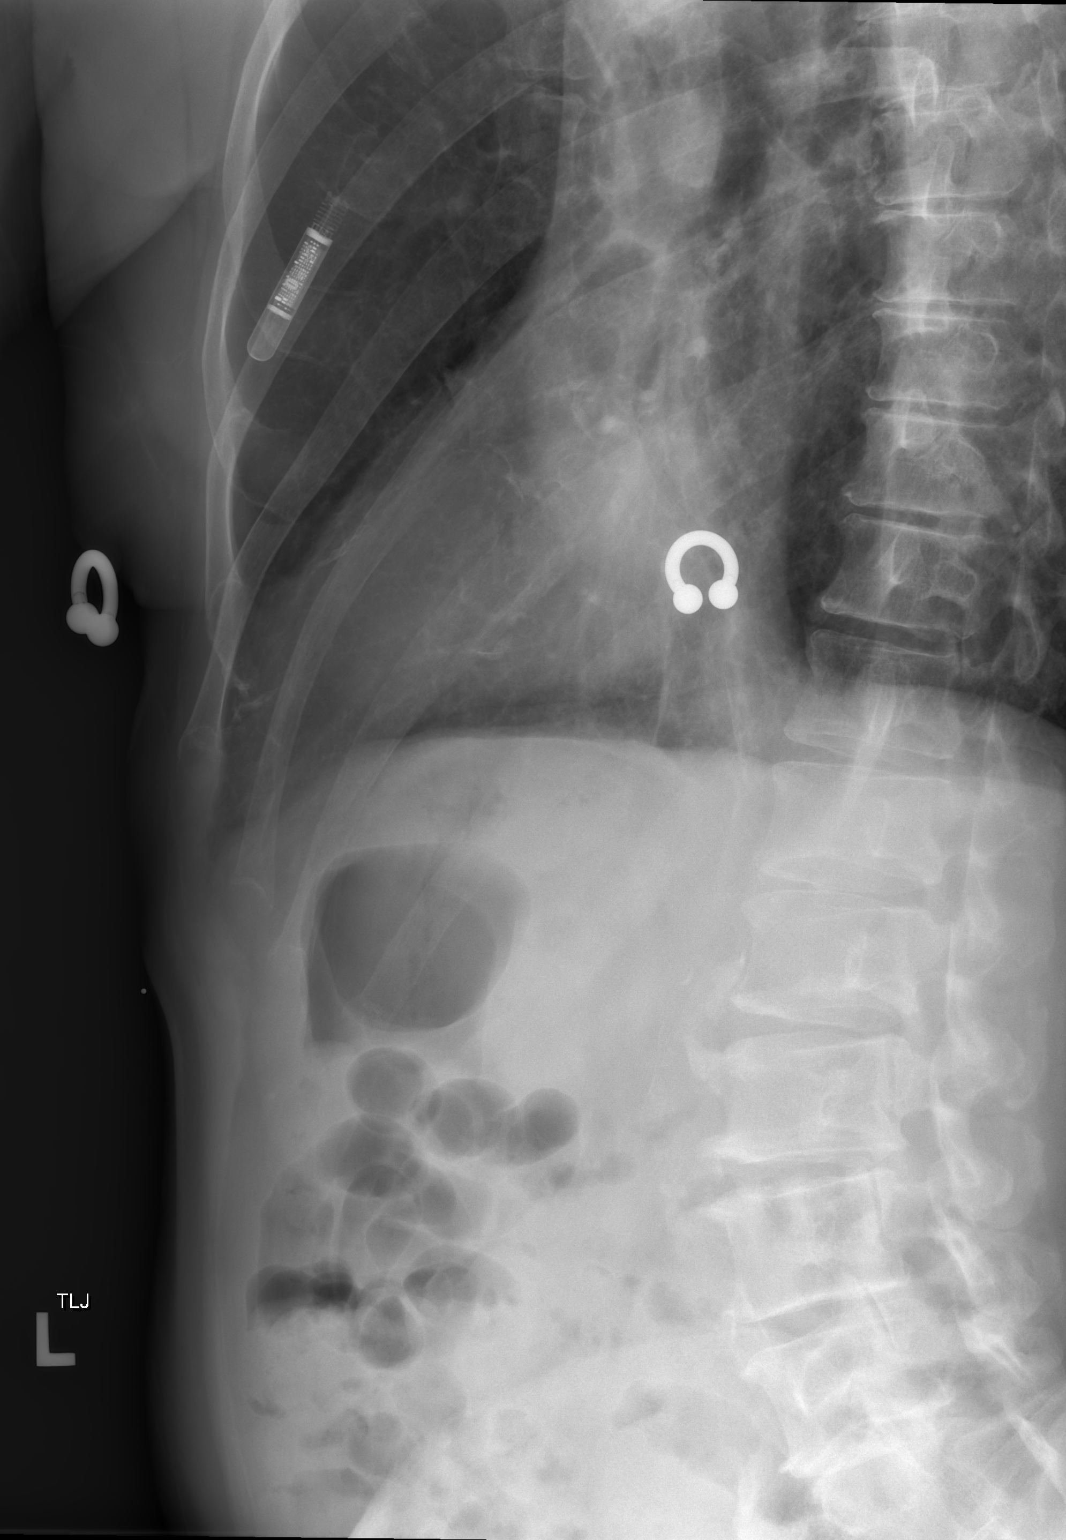

[2 of 2 positions shown; findings below may reference images not displayed]

FINDINGS: Upper ribs not imaged. No evidence of displaced rib fracture or
pneumothorax. Degenerative changes and scoliosis thoracolumbar
spine. Cardiac monitoring device noted.
IMPRESSION: Upper ribs not imaged. No evidence of displaced rib fracture or
pneumothorax.

## 2022-09-29 ENCOUNTER — Ambulatory Visit (INDEPENDENT_AMBULATORY_CARE_PROVIDER_SITE_OTHER): Payer: Medicare Other | Admitting: Internal Medicine

## 2022-09-29 ENCOUNTER — Encounter: Payer: Self-pay | Admitting: Internal Medicine

## 2022-09-29 VITALS — BP 110/70 | HR 102 | Ht 66.0 in | Wt 130.0 lb

## 2022-09-29 DIAGNOSIS — E039 Hypothyroidism, unspecified: Secondary | ICD-10-CM | POA: Diagnosis not present

## 2022-09-29 DIAGNOSIS — Z87891 Personal history of nicotine dependence: Secondary | ICD-10-CM

## 2022-09-29 DIAGNOSIS — L659 Nonscarring hair loss, unspecified: Secondary | ICD-10-CM | POA: Diagnosis not present

## 2022-09-29 NOTE — Telephone Encounter (Signed)
Sean Weaver called and I scheduled him for a morning appointment.

## 2022-09-29 NOTE — Progress Notes (Shared)
Patient Care Team: Margaree Mackintosh, MD as PCP - General (Internal Medicine)  Visit Date: 09/29/22  Subjective:    Patient ID: Sean Weaver , Male   DOB: 1955/06/28, 67 y.o.    MRN: [id number]   67 y.o. Male presents today for swelling in cervical neck area, hair loss. He would like to consider hair loss treatments available from online sellers before pursing further treatment. History of smoking.   Past Medical History:  Diagnosis Date   GERD (gastroesophageal reflux disease)    resolved per pt report   Hyperlipidemia    Hypertension    Hypothyroidism    Psoriatic arthritis ([facility])    Stroke (cerebrum) ([facility])    Tubular adenoma      Family History  Problem Relation Age of Onset   Cancer Mother        ovarian   Cancer Father        prostate    Kidney failure Father    Hypertension Sister    Non-Hodgkin's lymphoma Sister    Migraines Neg Hx    Headache Neg Hx     Social History   Social History Narrative   Lives at home alone  In [place].   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  HENT:  Negative for congestion.        (+) Alopecia  Eyes:  Negative for blurred vision.  Respiratory:  Negative for cough and shortness of breath.   Cardiovascular:  Negative for chest pain, palpitations and leg swelling.  Gastrointestinal:  Negative for vomiting.  Musculoskeletal:  Negative for back pain.  Skin:  Negative for rash.       (+) Swelling cervical neck  Neurological:  Negative for loss of consciousness and headaches.        Objective:   Vitals: BP 110/70   Pulse (!) 102   Ht 5\' 6"  (1.676 m)   Wt 130 lb (59 kg)   SpO2 98%   BMI 20.98 kg/m    Physical Exam Vitals and nursing note reviewed.  Constitutional:      General: He is not in acute distress.    Appearance: Normal appearance. He is not ill-appearing.  HENT:     Head:  Normocephalic and atraumatic.  Neck:     Thyroid: No thyromegaly.     Comments: Slightly enlarged anterior cervical nodes bilaterally- not firm. Pulmonary:     Effort: Pulmonary effort is normal.  Skin:    General: Skin is warm and dry.  Neurological:     Mental Status: He is alert and oriented to person, place, and time. Mental status is at baseline.  Psychiatric:        Mood and Affect: Mood normal.        Behavior: Behavior normal.        Thought Content: Thought content normal.        Judgment: Judgment normal.       Results:   Studies obtained and personally reviewed by me:   Labs:       Component Value Date/Time   NA 139 06/22/2022 0955   NA 140 02/21/2018 0855   K 4.4 06/22/2022 0955   CL 104 06/22/2022 0955   CO2 28 06/22/2022 0955   GLUCOSE 97 06/22/2022 0955   BUN 12 06/22/2022 0955   BUN 17 02/21/2018  0855   CREATININE 1.14 06/22/2022 0955   CALCIUM 9.4 06/22/2022 0955   PROT 7.2 03/16/2022 1017   ALBUMIN 3.9 06/02/2016 1033   AST 17 03/16/2022 1017   ALT 15 03/16/2022 1017   ALKPHOS 89 06/02/2016 1033   BILITOT 0.6 03/16/2022 1017   GFRNONAA 58 (L) 02/19/2020 0918   GFRAA 67 02/19/2020 0918     Lab Results  Component Value Date   WBC 6.4 03/16/2022   HGB 15.7 03/16/2022   HCT 46.2 03/16/2022   MCV 92.4 03/16/2022   PLT 284 03/16/2022    Lab Results  Component Value Date   CHOL 194 03/16/2022   HDL 84 03/16/2022   LDLCALC 96 03/16/2022   TRIG 54 03/16/2022   CHOLHDL 2.3 03/16/2022    Lab Results  Component Value Date   HGBA1C 5.4 08/12/2020     Lab Results  Component Value Date   TSH 0.97 03/16/2022     Lab Results  Component Value Date   PSA 1.02 03/16/2022   PSA 1.15 09/01/2021   PSA 1.29 03/03/2021      Assessment & Plan:   Cervical adenopathy: referral to ENT for further evaluation.   Hypothyroidism: treated with Synthroid 100 mcg daily. Ordered TSH, free T4.  Alopecia: he prefers to look into treatments online  before pursuing further treatment.   Ordered coronary calcium score.  Thyroid functions are normal.  Continue same dose of thyroid replacement.  Coronary calcium scoring ordered to check for coronary artery disease and this will also include a chest CT which may be helpful with evaluation of cervical adenopathy in the setting of smoking.  Refer patient to ENT for evaluation of cervical adenopathy in the setting of smoking and to see if neck CT is necessary.  I,Alexander Ruley,acting as a Neurosurgeon for Margaree Mackintosh, MD.,have documented all relevant documentation on the behalf of Margaree Mackintosh, MD,as directed by  Margaree Mackintosh, MD while in the presence of Margaree Mackintosh, MD.   I, Margaree Mackintosh, MD, have reviewed all documentation for this visit. The documentation on 10/01/22 for the exam, diagnosis, procedures, and orders are all accurate and complete.

## 2022-09-30 LAB — TSH: TSH: 2.07 mIU/L (ref 0.40–4.50)

## 2022-09-30 LAB — T4, FREE: Free T4: 1.1 ng/dL (ref 0.8–1.8)

## 2022-10-01 ENCOUNTER — Other Ambulatory Visit: Payer: Self-pay | Admitting: Internal Medicine

## 2022-10-01 NOTE — Patient Instructions (Addendum)
Referral to ENT for evaluation of cervical adenopathy in the setting of smoking. Coronary calcium scoring ordered which will also include Chest CT. Thyroid functions are normal. Continue same dose of thyroid replacement medication. Consider CT of neck if persistent adenopathy is present depending on ENT evaluation.

## 2022-10-05 ENCOUNTER — Ambulatory Visit (HOSPITAL_COMMUNITY)
Admission: RE | Admit: 2022-10-05 | Discharge: 2022-10-05 | Disposition: A | Discharge status: Home / Self Care | Payer: Medicare Other | Source: Ambulatory Visit | Attending: Internal Medicine | Admitting: Internal Medicine

## 2022-10-05 DIAGNOSIS — Z87891 Personal history of nicotine dependence: Secondary | ICD-10-CM | POA: Insufficient documentation

## 2022-10-12 ENCOUNTER — Ambulatory Visit (HOSPITAL_COMMUNITY): Payer: Medicare Other

## 2022-12-21 DIAGNOSIS — J387 Other diseases of larynx: Secondary | ICD-10-CM | POA: Diagnosis not present

## 2022-12-21 DIAGNOSIS — Z7901 Long term (current) use of anticoagulants: Secondary | ICD-10-CM | POA: Diagnosis not present

## 2022-12-21 DIAGNOSIS — K219 Gastro-esophageal reflux disease without esophagitis: Secondary | ICD-10-CM | POA: Diagnosis not present

## 2022-12-21 DIAGNOSIS — Z720 Tobacco use: Secondary | ICD-10-CM | POA: Diagnosis not present

## 2022-12-21 DIAGNOSIS — R1310 Dysphagia, unspecified: Secondary | ICD-10-CM | POA: Diagnosis not present

## 2022-12-22 ENCOUNTER — Other Ambulatory Visit: Payer: Self-pay | Admitting: Otolaryngology

## 2022-12-22 DIAGNOSIS — R1310 Dysphagia, unspecified: Secondary | ICD-10-CM

## 2023-01-12 ENCOUNTER — Ambulatory Visit
Admission: RE | Admit: 2023-01-12 | Discharge: 2023-01-12 | Disposition: A | Discharge status: Home / Self Care | Payer: Medicare Other | Source: Ambulatory Visit | Attending: Otolaryngology

## 2023-01-12 DIAGNOSIS — R1310 Dysphagia, unspecified: Secondary | ICD-10-CM

## 2023-02-24 ENCOUNTER — Other Ambulatory Visit: Payer: Self-pay | Admitting: Internal Medicine

## 2023-03-15 ENCOUNTER — Encounter: Payer: Self-pay | Admitting: Internal Medicine

## 2023-03-15 ENCOUNTER — Ambulatory Visit (INDEPENDENT_AMBULATORY_CARE_PROVIDER_SITE_OTHER): Admitting: Internal Medicine

## 2023-03-15 VITALS — BP 120/80 | HR 115 | Ht 66.0 in | Wt 135.0 lb

## 2023-03-15 DIAGNOSIS — B958 Unspecified staphylococcus as the cause of diseases classified elsewhere: Secondary | ICD-10-CM

## 2023-03-15 DIAGNOSIS — L409 Psoriasis, unspecified: Secondary | ICD-10-CM | POA: Diagnosis not present

## 2023-03-15 DIAGNOSIS — L089 Local infection of the skin and subcutaneous tissue, unspecified: Secondary | ICD-10-CM | POA: Diagnosis not present

## 2023-03-15 MED ORDER — CEPHALEXIN 500 MG PO CAPS: 500.0000 mg | ORAL_CAPSULE | Freq: Two times a day (BID) | ORAL | 0 refills | Status: DC

## 2023-03-15 NOTE — Progress Notes (Signed)
 Patient Care Team: Margaree Mackintosh, MD as PCP - General (Internal Medicine)  Visit Date: 03/15/23  Subjective:   Chief Complaint  Patient presents with   Eczema    On hands for several months comes and goes.   Patient WU:[id number] Marsalis, Beaulieu DOB:1955-02-05,67 y.o. [id number]   68 y.o. Male presents today for acute visit with Eczema, Hands Bilaterally. Longstanding hx of Psoriasis, recently worsened; on Humira per Rheumatology. Says that he has had a flare-up for the past couple of months, and he does have an appointment with Dr. Karlyn Agee w/ [facility] next week but couldn't wait any longer due to the pain and concern that he may develop an infection as last year he had a similar issue which did develop a Staph infection. He says that in the past, Dr. Yetta Barre has given him Keflex in the past and he has responded well.  Past Medical History:  Diagnosis Date   GERD (gastroesophageal reflux disease)    resolved per pt report   Hyperlipidemia    Hypertension    Hypothyroidism    Psoriatic arthritis ([facility])    Stroke (cerebrum) ([facility])    Tubular adenoma     Allergies  Allergen Reactions   Aspirin Swelling    Family History  Problem Relation Age of Onset   Cancer Mother        ovarian   Cancer Father        prostate    Kidney failure Father    Hypertension Sister    Non-Hodgkin's lymphoma Sister    Migraines Neg Hx    Headache Neg Hx    Social History   Social History Narrative   Lives at home alone  In [place].   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  Review of Systems  Skin:  Positive for rash (eczema, hands).     Objective:  Vitals: BP 120/80   Pulse (!) 115   Ht 5\' 6"  (1.676 m)   Wt 135 lb (61.2 kg)   SpO2 97%   BMI 21.79 kg/m   Physical Exam Vitals and nursing note reviewed.  Constitutional:      General: He is not in  acute distress.    Appearance: Normal appearance. He is not ill-appearing.  HENT:     Head: Normocephalic and atraumatic.  Pulmonary:     Effort: Pulmonary effort is normal.  Skin:    General: Skin is warm and dry.     Comments: Refer to picture attached.   Neurological:     Mental Status: He is alert and oriented to person, place, and time. Mental status is at baseline.  Psychiatric:        Mood and Affect: Mood normal.        Behavior: Behavior normal.        Thought Content: Thought content normal.        Judgment: Judgment normal.     Results:  Studies Obtained And Personally Reviewed By Me: Labs:     Component Value Date/Time   NA 139 06/22/2022 0955   NA 140 02/21/2018 0855   K 4.4 06/22/2022 0955   CL 104 06/22/2022 0955   CO2 28 06/22/2022 0955   GLUCOSE 97 06/22/2022 0955   BUN 12 06/22/2022 0955   BUN 17 02/21/2018 0855   CREATININE 1.14 06/22/2022 0955   CALCIUM 9.4 06/22/2022 0955  PROT 7.2 03/16/2022 1017   ALBUMIN 3.9 06/02/2016 1033   AST 17 03/16/2022 1017   ALT 15 03/16/2022 1017   ALKPHOS 89 06/02/2016 1033   BILITOT 0.6 03/16/2022 1017   GFRNONAA 58 (L) 02/19/2020 0918   GFRAA 67 02/19/2020 0918    Lab Results  Component Value Date   WBC 6.4 03/16/2022   HGB 15.7 03/16/2022   HCT 46.2 03/16/2022   MCV 92.4 03/16/2022   PLT 284 03/16/2022   Lab Results  Component Value Date   CHOL 194 03/16/2022   HDL 84 03/16/2022   LDLCALC 96 03/16/2022   TRIG 54 03/16/2022   CHOLHDL 2.3 03/16/2022   Lab Results  Component Value Date   HGBA1C 5.4 08/12/2020    Lab Results  Component Value Date   TSH 2.07 09/29/2022    Lab Results  Component Value Date   PSA 1.02 03/16/2022   PSA 1.15 09/01/2021   PSA 1.29 03/03/2021     Assessment & Plan:   Psoriasis; Staphylococcus Infection, Skin: per patient request as he has responded well to this before, sending in 500 mg Keflex - take 1 capsule (500 mg total) by mouth 2 (two) times daily. Says will  be seeing his dermatologist, Dr. Karlyn Agee, next week.    I,Emily Lagle,acting as a Neurosurgeon for Margaree Mackintosh, MD.,have documented all relevant documentation on the behalf of Margaree Mackintosh, MD,as directed by  Margaree Mackintosh, MD while in the presence of Margaree Mackintosh, MD.   I, Margaree Mackintosh, MD, have reviewed all documentation for this visit. The documentation on 03/31/23 for the exam, diagnosis, procedures, and orders are all accurate and complete.

## 2023-03-18 ENCOUNTER — Other Ambulatory Visit: Payer: Self-pay | Admitting: Medical Genetics

## 2023-03-22 ENCOUNTER — Other Ambulatory Visit (HOSPITAL_COMMUNITY)
Admission: RE | Admit: 2023-03-22 | Discharge: 2023-03-22 | Disposition: A | Discharge status: Home / Self Care | Payer: Self-pay | Source: Ambulatory Visit | Attending: Medical Genetics | Admitting: Medical Genetics

## 2023-03-22 ENCOUNTER — Other Ambulatory Visit: Payer: Medicare Other

## 2023-03-22 DIAGNOSIS — Z0000 Encounter for general adult medical examination without abnormal findings: Secondary | ICD-10-CM

## 2023-03-23 LAB — CBC WITH DIFFERENTIAL/PLATELET
Absolute Lymphocytes: 1539 {cells}/uL (ref 850–3900)
Absolute Monocytes: 680 {cells}/uL (ref 200–950)
Basophils Absolute: 89 {cells}/uL (ref 0–200)
Basophils Relative: 1.1 %
Eosinophils Absolute: 122 {cells}/uL (ref 15–500)
Eosinophils Relative: 1.5 %
HCT: 47.1 % (ref 38.5–50.0)
Hemoglobin: 15.8 g/dL (ref 13.2–17.1)
MCH: 31.4 pg (ref 27.0–33.0)
MCHC: 33.5 g/dL (ref 32.0–36.0)
MCV: 93.6 fL (ref 80.0–100.0)
MPV: 10.8 fL (ref 7.5–12.5)
Monocytes Relative: 8.4 %
Neutro Abs: 5670 {cells}/uL (ref 1500–7800)
Neutrophils Relative %: 70 %
Platelets: 332 10*3/uL (ref 140–400)
RBC: 5.03 10*6/uL (ref 4.20–5.80)
RDW: 13.1 % (ref 11.0–15.0)
Total Lymphocyte: 19 %
WBC: 8.1 10*3/uL (ref 3.8–10.8)

## 2023-03-23 LAB — COMPREHENSIVE METABOLIC PANEL
AG Ratio: 1.5 (calc) (ref 1.0–2.5)
ALT: 14 U/L (ref 9–46)
AST: 17 U/L (ref 10–35)
Albumin: 4.4 g/dL (ref 3.6–5.1)
Alkaline phosphatase (APISO): 75 U/L (ref 35–144)
BUN: 15 mg/dL (ref 7–25)
CO2: 26 mmol/L (ref 20–32)
Calcium: 9.4 mg/dL (ref 8.6–10.3)
Chloride: 102 mmol/L (ref 98–110)
Creat: 1.16 mg/dL (ref 0.70–1.35)
Globulin: 3 g/dL (ref 1.9–3.7)
Glucose, Bld: 85 mg/dL (ref 65–99)
Potassium: 5.2 mmol/L (ref 3.5–5.3)
Sodium: 138 mmol/L (ref 135–146)
Total Bilirubin: 0.7 mg/dL (ref 0.2–1.2)
Total Protein: 7.4 g/dL (ref 6.1–8.1)

## 2023-03-23 LAB — LIPID PANEL
Cholesterol: 184 mg/dL (ref <200)
HDL: 83 mg/dL (ref >=40)
LDL Cholesterol (Calc): 85 mg/dL
Non-HDL Cholesterol (Calc): 101 mg/dL (ref <130)
Total CHOL/HDL Ratio: 2.2 (calc) (ref <5.0)
Triglycerides: 74 mg/dL (ref <150)

## 2023-03-23 LAB — TSH: TSH: 0.95 m[IU]/L (ref 0.40–4.50)

## 2023-03-23 LAB — PSA: PSA: 1.09 ng/mL (ref <=4.00)

## 2023-03-29 ENCOUNTER — Ambulatory Visit (INDEPENDENT_AMBULATORY_CARE_PROVIDER_SITE_OTHER): Payer: Medicare Other | Admitting: Internal Medicine

## 2023-03-29 ENCOUNTER — Encounter: Payer: Self-pay | Admitting: Internal Medicine

## 2023-03-29 VITALS — BP 126/68 | HR 93 | Ht 66.0 in | Wt 131.4 lb

## 2023-03-29 DIAGNOSIS — Z7901 Long term (current) use of anticoagulants: Secondary | ICD-10-CM

## 2023-03-29 DIAGNOSIS — L409 Psoriasis, unspecified: Secondary | ICD-10-CM | POA: Diagnosis not present

## 2023-03-29 DIAGNOSIS — Z8673 Personal history of transient ischemic attack (TIA), and cerebral infarction without residual deficits: Secondary | ICD-10-CM

## 2023-03-29 DIAGNOSIS — Z87891 Personal history of nicotine dependence: Secondary | ICD-10-CM

## 2023-03-29 DIAGNOSIS — Z1211 Encounter for screening for malignant neoplasm of colon: Secondary | ICD-10-CM

## 2023-03-29 DIAGNOSIS — I10 Essential (primary) hypertension: Secondary | ICD-10-CM

## 2023-03-29 DIAGNOSIS — Z0000 Encounter for general adult medical examination without abnormal findings: Secondary | ICD-10-CM | POA: Diagnosis not present

## 2023-03-29 DIAGNOSIS — E7800 Pure hypercholesterolemia, unspecified: Secondary | ICD-10-CM | POA: Diagnosis not present

## 2023-03-29 DIAGNOSIS — E039 Hypothyroidism, unspecified: Secondary | ICD-10-CM

## 2023-03-29 DIAGNOSIS — Z860101 Personal history of adenomatous and serrated colon polyps: Secondary | ICD-10-CM

## 2023-03-29 MED ORDER — SILDENAFIL CITRATE 50 MG PO TABS: 50.0000 mg | ORAL_TABLET | Freq: Every day | ORAL | 0 refills | Status: DC | PRN
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
## 2023-03-30 NOTE — Patient Instructions (Addendum)
 Labs reviewed and are stable - all within normal limits. Hand dermatitis improved with antibiotics. Mood is stable on current medications and patient planning [place]. Continue current meds and will plan for follow up here in 6 months unless he moves away from the area. Time for colonoscopy- referral made.

## 2023-03-31 NOTE — Patient Instructions (Signed)
 Patient has hand dermatitis with secondary bacterial infection (staph).  He will take Keflex 500 mg twice daily for 7 to 10 days.  He will call if not improving within a few days.

## 2023-04-02 LAB — GENECONNECT MOLECULAR SCREEN: Genetic Analysis Overall Interpretation: NEGATIVE

## 2023-04-06 ENCOUNTER — Other Ambulatory Visit: Payer: Self-pay | Admitting: Internal Medicine

## 2023-04-12 ENCOUNTER — Encounter: Payer: Self-pay | Admitting: Nurse Practitioner

## 2023-06-07 ENCOUNTER — Other Ambulatory Visit: Payer: Self-pay | Admitting: Internal Medicine

## 2023-06-07 ENCOUNTER — Other Ambulatory Visit: Payer: Self-pay | Admitting: Neurology

## 2023-06-07 DIAGNOSIS — I639 Cerebral infarction, unspecified: Secondary | ICD-10-CM

## 2023-06-10 ENCOUNTER — Other Ambulatory Visit: Payer: Self-pay | Admitting: Neurology

## 2023-06-10 DIAGNOSIS — I639 Cerebral infarction, unspecified: Secondary | ICD-10-CM

## 2023-06-10 MED ORDER — CLOPIDOGREL BISULFATE 75 MG PO TABS: 75.0000 mg | ORAL_TABLET | Freq: Every day | ORAL | 4 refills | Status: DC

## 2023-06-13 NOTE — Progress Notes (Addendum)
 06/13/2023 KAIEN PEZZULLO [id number] 1955-05-16   CHIEF COMPLAINT: Discuss scheduling a colonoscopy   HISTORY OF PRESENT ILLNESS: Lamar JONETTA. Kellogg is a 68 year old male with a past medical history of depression, hypertension, hyperlipidemia, cryptogenic CVA on Plavix  s/p loop recorder placement 07/2018,  hypothyroidism, psoriasis and psoriatic arthritis on Humira, GERD and colon polyps. He presents to our office today as referred by Dr. Ronal Hailstone to schedule a colonoscopy. He underwent a colonoscopy by Dr. Obie 08/24/2005 which identified a 10mm tubulovillous adenomatous polyp removed from the sigmoid colon and rectal biopsies showed a hyperplastic polyp. He denies having any further colonoscopies since then. Father with history of colon polyps. No known family history of colorectal cancer. He is passing normal formed brown bowel movement once daily. No bloody or black stools. He has a remote history of GERD, no heartburn.  He has dysphagia, describes food with textures such as bread and cake gets stuck in his throat/upper esophagus which results in gagging and the stuck food comes out which has occurred intermittently for the past 9 to 10 months. He underwent a barium swallow study with tablet 01/12/2023 which identified a small sliding hiatal hernia without esophageal stricture or reflux. He was seen by ENT and underwent laryngoscopy 12/21/2022 which showed a 2 cm cystic lesion on the left vallecula without obstruction and possible evidence of laryngeal reflux. He denies taking any reflux medications at this time.  He has a history of a cryptogenic CVA in 2020, loop recorder negative for A-fib and he remains on Plavix  daily as prescribed by his neurologist. No history of coronary artery disease. However, he is tachycardic in office at this time. Heart rate 132, repeat heart rate 124 b/min. He is asymptomatic. No chest pain, dizziness, palpitations or shortness of breath. He typically drinks 2-1/2  cups of coffee every morning which may be a contributing factor. He has psoriasis with psoriatic arthritis on Humira every 2 weeks, last injection was 1 week ago.      Latest Ref Rng & Units 03/22/2023   12:34 PM 03/16/2022   10:17 AM 09/01/2021    9:15 AM  CBC  WBC 3.8 - 10.8 Thousand/uL 8.1  6.4  6.4   Hemoglobin 13.2 - 17.1 g/dL 84.1  84.2  84.1   Hematocrit 38.5 - 50.0 % 47.1  46.2  47.1   Platelets 140 - 400 Thousand/uL 332  284  270        Latest Ref Rng & Units 03/22/2023   12:34 PM 06/22/2022    9:55 AM 03/16/2022   10:17 AM  CMP  Glucose 65 - 99 mg/dL 85  97  98   BUN 7 - 25 mg/dL 15  12  19    Creatinine 0.70 - 1.35 mg/dL 8.83  8.85  8.67   Sodium 135 - 146 mmol/L 138  139  140   Potassium 3.5 - 5.3 mmol/L 5.2  4.4  5.1   Chloride 98 - 110 mmol/L 102  104  104   CO2 20 - 32 mmol/L 26  28  27    Calcium  8.6 - 10.3 mg/dL 9.4  9.4  9.7   Total Protein 6.1 - 8.1 g/dL 7.4   7.2   Total Bilirubin 0.2 - 1.2 mg/dL 0.7   0.6   AST 10 - 35 U/L 17   17   ALT 9 - 46 U/L 14   15     Barium swallow with tablet 01/12/2023: FINDINGS: No  definite mass or stricture is noted in the esophagus. Small sliding-type hiatal hernia is noted. No reflux is noted. Barium tablet passed through esophagus and into stomach without difficulty or delay.   IMPRESSION: Small sliding-type hiatal hernia. No other abnormality seen in the esophagus.  Laryngoscopy by ENT secondary to dysphagia 12/21/2022: Procedure:  After adequate topical anesthetic was applied, 4 mm flexible laryngoscope  was passed through the nasal cavity without difficulty.  Flexible laryngoscopy shows patent anterior nasal cavity with minimal  crusting, no discharge or infection.  Approximately 2cm cystic lesion of the left vallecula, does not obstruct  the glottis.  Moderate interarytenoid edema and erythema consistent with  laryngopharyngeal reflux noted, otherwise normal base of tongue and  supraglottis  Normal vocal cord  mobility without vocal cord nodule, mass, polyp or  tumor. Hypopharynx normal without mass, pooling of secretions or  aspiration.    ECHO 04/12/2018: 1. The left ventricle has normal systolic function, with an ejection  fraction of 55-60%. The cavity size was normal. Left ventricular diastolic  Doppler parameters are consistent with impaired relaxation. No evidence of  left ventricular regional wall  motion abnormalities.   2. The right ventricle has normal systolic function. The cavity was  normal. There is no increase in right ventricular wall thickness.   3. The aortic valve is tricuspid. No stenosis of the aortic valve.   4. The aortic root is normal in size and structure.   5. Negative bubble study.   6. In certain views, there is concern for a right atrial mass. However, I  think that this is probably a Eustachian valve or enfolding of the right  atrial wall.   7. No evidence of mitral valve stenosis. No mitral regurgitation.   8. Normal IVC size. No complete TR doppler jet so unable to estimate PA  systolic pressure.   PAST GI PROCEDURES:  Colonoscopy 08/24/2005 by Dr. Obie: - One 10mm polyp removed from the descending colon  1. DESCENDING COLON AT 30CM (ENDOSCOPIC BIOPSY):   - TUBULOVILLOUS ADENOMA.   - NO HIGH-GRADE DYSPLASIA IDENTIFIED.    2. RECTUM (ENDOSCOPIC BIOPSY):   - HYPERPLASTIC POLYP.   - NO ADENOMATOUS MUCOSA IDENTIFIED.    Past Medical History:  Diagnosis Date   GERD (gastroesophageal reflux disease)    resolved per pt report   Hyperlipidemia    Hypertension    Hypothyroidism    Psoriatic arthritis (HCC)    Stroke (cerebrum) (HCC)    Tubular adenoma    Past Surgical History:  Procedure Laterality Date   dental procedure     removed tooth, will receive implant    implantable loop recorder placement  07/15/2018   Medtronic Reveal Linq model [id number] implantable loop recorder (SN [id number])   Social History: He is single.  He is a Surveyor, minerals.  He reports that he has been smoking cigarettes. He has never used smokeless tobacco. He reports current alcohol use of about 14.0 standard drinks of alcohol per week. He reports current drug use. Frequency: 4.00 times per week. Drug: Marijuana.  Family History: Father had colon polyps and prostate cancer. Mother had breast and ovarian cancer.  Sister with hypertension.  Allergies  Allergen Reactions   Aspirin Swelling      Outpatient Encounter Medications as of 06/14/2023  Medication Sig   Adalimumab (HUMIRA) 40 MG/0.4ML PSKT Inject into the skin. EVERY OTHER WEEK   buPROPion  (WELLBUTRIN  XL) 150 MG 24 hr tablet TAKE 1 TABLET BY MOUTH DAILY  cephALEXin  (KEFLEX ) 500 MG capsule Take 1 capsule (500 mg total) by mouth 2 (two) times daily.   clopidogrel  (PLAVIX ) 75 MG tablet Take 1 tablet (75 mg total) by mouth daily.   ramipril  (ALTACE ) 10 MG capsule TAKE 1 CAPSULE BY MOUTH DAILY   rosuvastatin  (CRESTOR ) 20 MG tablet TAKE 1 TABLET BY MOUTH DAILY   sildenafil  (REVATIO ) 20 MG tablet Take as directed   sildenafil  (VIAGRA ) 50 MG tablet Take 1 tablet (50 mg total) by mouth daily as needed for erectile dysfunction.   SYNTHROID  100 MCG tablet TAKE 1 TABLET BY MOUTH DAILY   triamcinolone  cream (KENALOG ) 0.1 % Apply topically 2 (two) times daily.   No facility-administered encounter medications on file as of 06/14/2023.   REVIEW OF SYSTEMS:  Gen: Denies fever, sweats or chills. No weight loss.  CV: Denies chest pain, palpitations or edema. Resp: Denies cough, shortness of breath of hemoptysis.  GI: See HPI. GU: Denies urinary burning, blood in urine, increased urinary frequency or incontinence. MS: Denies joint pain, muscles aches or weakness. Derm:+ Psoriasis.  Psych: Denies depression, anxiety, memory loss or confusion. Heme: Denies bruising, easy bleeding. Neuro:  Denies headaches, dizziness or paresthesias. Endo:  Denies any problems with DM, thyroid  or adrenal  function.  PHYSICAL EXAM: BP 132/70 (BP Location: Left Arm, Patient Position: Sitting)   Pulse (!) 132   Ht 5' 5 (1.651 m) Comment: height measured without shoes  Wt 131 lb 6 oz (59.6 kg)   BMI 21.86 kg/m   General: 68 year old male in no acute distress. Head: Normocephalic and atraumatic. Eyes:  Sclerae non-icteric, conjunctive pink. Ears: Normal auditory acuity. Mouth: Dentition intact. No ulcers or lesions.  Neck: Supple, no lymphadenopathy or thyromegaly.  Lungs: Clear bilaterally to auscultation without wheezes, crackles or rhonchi. Heart: Regular rate and rhythm. No murmur, rub or gallop appreciated.  Abdomen: Soft, nontender, nondistended. No masses. No hepatosplenomegaly. Normoactive bowel sounds x 4 quadrants.  Rectal: Deferred.  Musculoskeletal: Symmetrical with no gross deformities. Skin: Psoriasis plaques on upper extremities.  Extremities: No edema. Neurological: Alert oriented x 4, no focal deficits.  Psychological: Alert and cooperative. Normal mood and affect.  ASSESSMENT AND PLAN:  68 year old male with a history of a 10mm tubulovillous adenoma removed from the descending colon per colonoscopy 08/2005.  Father with history of colon polyps. - Colonoscopy benefits and risks discussed including risk with sedation, risk of bleeding, perforation and infection  - Colonoscopy to be scheduled after cardiac clearance received  Dysphagia x 9 to 10 months. Barium swallow study with tablet 01/12/2023 identified a small sliding hiatal hernia without esophageal stricture or reflux. Laryngoscopy 12/21/2022 showed a 2 cm cystic lesion on the left vallecula without obstruction and possible evidence of laryngeal reflux.  - EGD benefits and risks discussed including risk with sedation, risk of bleeding, perforation and infection - EGD to be scheduled after cardiac clearance received - Follow-up with ENT  CVA on Plavix   - Our office will contact the patient's neurologist Dr. Ines to  verify Plavix  hold instructions prior to proceeding with an EGD and colonoscopy  Tachycardia, heart rate 132 b/min - 124 b/min in office today.  Normal BP 132/70.  No chest pain, dizziness, palpitations or shortness of breath.  Patient drinks 2-1/2 cups of coffee every morning, may be a contributing factor. - Reduce caffeine intake, increase water intake - Cardiology consult to include cardiac clearance prior to scheduling EGD and colonoscopy - Patient instructed to go to the emergency  room if he develops any chest pain, dizziness, palpitations or shortness of breath  Psoriasis/psoriatic arthritis on Humira  ADDENDUM: CARDIAC CLEARANCE RECEIVED AS FOLLOWS:  06/28/2023   Re: VIGGO PERKO   DOB: Jul 16, 1955             MRN: [id number]   Address: [address] [place] [address]     Dear Elida Shawl, NP,   Lamar JONETTA Herring is at low risk, from a cardiac standpoint, for his upcoming procedure: EGD/colonoscopy.  It is ok to proceed without further cardiac testing.   If applicable can hold Plavix  for 5 day(s) prior to procedure and re-start when appropriate from GI standpoint days post procedure.   Please call Dept: [phone] with any additional questions.     Sincerely,         Gordy Bergamo, MD, FACC   CC:  Perri Ronal PARAS, MD

## 2023-06-14 ENCOUNTER — Telehealth: Payer: Self-pay | Admitting: *Deleted

## 2023-06-14 ENCOUNTER — Encounter: Payer: Self-pay | Admitting: Nurse Practitioner

## 2023-06-14 ENCOUNTER — Ambulatory Visit: Admitting: Nurse Practitioner

## 2023-06-14 VITALS — BP 132/70 | HR 132 | Ht 65.0 in | Wt 131.4 lb

## 2023-06-14 DIAGNOSIS — K449 Diaphragmatic hernia without obstruction or gangrene: Secondary | ICD-10-CM

## 2023-06-14 DIAGNOSIS — Z860100 Personal history of colon polyps, unspecified: Secondary | ICD-10-CM

## 2023-06-14 DIAGNOSIS — R000 Tachycardia, unspecified: Secondary | ICD-10-CM | POA: Diagnosis not present

## 2023-06-14 DIAGNOSIS — R1319 Other dysphagia: Secondary | ICD-10-CM

## 2023-06-14 DIAGNOSIS — R1310 Dysphagia, unspecified: Secondary | ICD-10-CM | POA: Diagnosis not present

## 2023-06-14 DIAGNOSIS — Z860101 Personal history of adenomatous and serrated colon polyps: Secondary | ICD-10-CM

## 2023-06-14 DIAGNOSIS — R9389 Abnormal findings on diagnostic imaging of other specified body structures: Secondary | ICD-10-CM | POA: Diagnosis not present

## 2023-06-14 NOTE — Telephone Encounter (Signed)
 I s/w the pt and stated that I will have the EP schedulers Cassie or Dellar Fenton call him with a new pt appt with Dr. Isidoro Margarita per preop APP Palmer Bobo, NP. Pt thanked me for the help and will await call from [place] or [place].

## 2023-06-14 NOTE — Telephone Encounter (Signed)
  Sean Weaver Jun 12, 1955 [id number]  06/14/2023   Dear Dr Vonda Guadeloupe:  We have scheduled the above named patient for a(n) EGD/Colonoscopy procedure which is TBD. Our records show that (s)he is on anticoagulation therapy.  Please advise as to whether the patient may come off their therapy of Plavix  5 days prior to their procedure which is scheduled for TBD.  Please route your response to Corbin Dess S or fax response to [phone].  Sincerely,    [facility]

## 2023-06-14 NOTE — Telephone Encounter (Signed)
   Name: Sean Weaver  DOB: October 23, 1955  MRN: [id number]  Primary Cardiologist: None  Chart reviewed as part of pre-operative protocol coverage. Because of [name]'s past medical history and time since last visit, he will require a follow-up new patient in-office visit in order to better assess preoperative cardiovascular risk.  Has not been seen in office since 2020. Needs new patient visit.   Pre-op covering staff: - Please schedule appointment and call patient to inform them. If patient already had an upcoming appointment within acceptable timeframe, please add "pre-op clearance" to the appointment notes so provider is aware. - Please contact requesting surgeon's office via preferred method (i.e, phone, fax) to inform them of need for appointment prior to surgery.  Ava Boatman, NP  06/14/2023, 12:07 PM

## 2023-06-14 NOTE — Telephone Encounter (Signed)
 FYI, Colleen, The Plavix  came ok to hold. Just waiting on the cardiac clearance

## 2023-06-14 NOTE — Patient Instructions (Signed)
 We will contact you to schedule EGD/Colonoscopy once we get cardiac and Plavix  hold clearance   Due to recent changes in healthcare laws, you may see the results of your imaging and laboratory studies on MyChart before your provider has had a chance to review them.  We understand that in some cases there may be results that are confusing or concerning to you. Not all laboratory results come back in the same time frame and the provider may be waiting for multiple results in order to interpret others.  Please give us  48 hours in order for your provider to thoroughly review all the results before contacting the office for clarification of your results.    I appreciate the  opportunity to care for you  Thank You   Sean Weaver

## 2023-06-14 NOTE — Telephone Encounter (Signed)
 [facility] Pre-operative Risk Assessment     Request for surgical clearance:     Endoscopy Procedure  What type of surgery is being performed?     EGD/Colonoscopy  When is this surgery scheduled?     TBD  What type of clearance is required ?  CARDIAC  Are there any medications that need to be held prior to surgery and how long? 0  Practice name and name of physician performing surgery?      [facility]  What is your office phone and fax number?      Phone- [phone]  Fax- [phone]  Anesthesia type (None, local, MAC, general) ?       MAC   Please route your response to [employer]

## 2023-06-16 NOTE — Telephone Encounter (Signed)
 Spoke w/ patient - he is scheduled for 6/23 with Dr. Mealor to re-establish care, as well as need for pre-op clearance.

## 2023-06-16 NOTE — Telephone Encounter (Signed)
 Sean Weaver, please verify if you entered a cardiology referral due to tachycardia, cardiac clearance required prior to the patient proceeding with an EGD and colonoscopy.   Plavix  hold was received by his neurologist with pasts CVA.

## 2023-06-16 NOTE — Telephone Encounter (Signed)
 Will update all parties involved pt has appt 06/28/23 Dr. Arlester Ladd to re-est care and preop clearance needed.

## 2023-06-16 NOTE — Telephone Encounter (Signed)
 Sean Weaver, [facility] [employer] Telephone note below

## 2023-06-16 NOTE — Telephone Encounter (Signed)
 Perfect

## 2023-06-17 ENCOUNTER — Other Ambulatory Visit: Payer: Self-pay | Admitting: *Deleted

## 2023-06-17 DIAGNOSIS — R000 Tachycardia, unspecified: Secondary | ICD-10-CM

## 2023-06-17 NOTE — Telephone Encounter (Signed)
 Fiato, Carol M, CMA    06/16/23 11:22 AM Note Will update all parties involved pt has appt 06/28/23 Dr. Arlester Ladd to re-est care and preop clearance needed.       FYI Colleen, Pt already has an appointment on 06/28/2023 with Dr Arlester Ladd. It was commented in our encounter on cardiac clearance   Thanks

## 2023-06-17 NOTE — Telephone Encounter (Signed)
 Robin, pls enter a cardiac referral to evaluate tachycardia, to also include cardiac clearance prior to endoscopic evaluation. Refer to office visit 6/9. THX.

## 2023-06-17 NOTE — Telephone Encounter (Signed)
 I will forward this back to preop APP for review of notes from Dr. Arlester Ladd and further recommendations.

## 2023-06-17 NOTE — Telephone Encounter (Signed)
 This appt with Dr. Arlester Ladd is not correct. Per Dr. Arlester Ladd feels pt does not need EP MD. Pt will need to see General Cardiologist. So we still need referral. Our team will need to reach out to the pt to set up new pt appt with gen card.   I am going to forward this back preop APP as review as well for any further recommendations and confirm we can cancel Dr. Arlester Ladd appt.

## 2023-06-17 NOTE — Telephone Encounter (Signed)
 He is still on the schedule with Dr Mealor for June 23rd.

## 2023-06-17 NOTE — Telephone Encounter (Signed)
 Colleen, Nevrmind I did the referral to Dr Arlester Ladd, I see they made he appointment then said he needed the referral after all. Referral sent as requested

## 2023-06-17 NOTE — Telephone Encounter (Signed)
 Since he is not being actively followed by cardiology, could ask requesting provider if cardiac clearance is necessary. If so, he will need a new pt appointment for cardiac clearance.

## 2023-06-17 NOTE — Telephone Encounter (Signed)
 I will fax notes to GI office to review notes from today.   If GI will need cardiac clearance we will need a new pt referral. Pt will then need to be scheduled a new pt appt.   Please see notes from Dr. Arlester Ladd and the preop APP Slater Duncan, NP.

## 2023-06-18 NOTE — Telephone Encounter (Signed)
 Sheryle Donning, Are you arranging the cardiac appointment... I didn't realize Dr Arlester Ladd wasn't cardiology.... Thank You for your help with this -Corbin Dess

## 2023-06-18 NOTE — Telephone Encounter (Signed)
 Good morning Sean Weaver, Yes we are going to sched new pt appt.   Dr. Arlester Ladd is a cardiologist but her specialize in EP (electrophysiology) so since the pt's loop recorder is no longer active he felt pt did not need to be seen by EP  CF I will tell you though our gen card new pt appts are out pretty far.   2 mins CF I believe we have not scheduled the patient yet until we have his clearance anyway -rs

## 2023-06-18 NOTE — Telephone Encounter (Signed)
 Left message for the pt to call back. See previous notes. Pt will need to be scheduled new pt appt with Gen card as per Dr. Arlester Ladd.   Pt's loop recorder is no longer active therefor Dr. Arlester Ladd feels the pt does not need to see EP.

## 2023-06-21 NOTE — Telephone Encounter (Signed)
 I s/w the pt and he has been scheduled as new pt for preop clearance 06/28/23 @ 11:410 with Dr. Berry Bristol.   I will update all parties involved.

## 2023-06-23 NOTE — Telephone Encounter (Signed)
Thank You Carol

## 2023-06-25 ENCOUNTER — Other Ambulatory Visit: Payer: Self-pay

## 2023-06-25 ENCOUNTER — Other Ambulatory Visit: Payer: Self-pay | Admitting: Internal Medicine

## 2023-06-25 DIAGNOSIS — N529 Male erectile dysfunction, unspecified: Secondary | ICD-10-CM

## 2023-06-25 MED ORDER — SILDENAFIL CITRATE 50 MG PO TABS
50.0000 mg | ORAL_TABLET | Freq: Every day | ORAL | 0 refills | Status: DC | PRN
Start: 2023-06-25 — End: 2023-10-04

## 2023-06-28 ENCOUNTER — Ambulatory Visit: Attending: Cardiology | Admitting: Cardiology

## 2023-06-28 ENCOUNTER — Encounter: Payer: Self-pay | Admitting: Cardiology

## 2023-06-28 ENCOUNTER — Ambulatory Visit: Admitting: Cardiovascular Disease

## 2023-06-28 VITALS — BP 148/76 | HR 90 | Resp 16 | Ht 65.0 in | Wt 127.8 lb

## 2023-06-28 DIAGNOSIS — Z01810 Encounter for preprocedural cardiovascular examination: Secondary | ICD-10-CM

## 2023-06-28 DIAGNOSIS — I639 Cerebral infarction, unspecified: Secondary | ICD-10-CM

## 2023-06-28 DIAGNOSIS — Z720 Tobacco use: Secondary | ICD-10-CM

## 2023-06-28 DIAGNOSIS — E7800 Pure hypercholesterolemia, unspecified: Secondary | ICD-10-CM

## 2023-06-28 DIAGNOSIS — I10 Essential (primary) hypertension: Secondary | ICD-10-CM | POA: Diagnosis not present

## 2023-06-28 MED ORDER — DILTIAZEM HCL ER COATED BEADS 180 MG PO CP24: 180.0000 mg | ORAL_CAPSULE | Freq: Every day | ORAL | 0 refills | Status: DC

## 2023-06-28 MED ORDER — EZETIMIBE 10 MG PO TABS: 10.0000 mg | ORAL_TABLET | Freq: Every day | ORAL | 0 refills | Status: DC

## 2023-06-28 NOTE — Telephone Encounter (Signed)
 Elida, FYI This patient is all cleared from  all standpoints and can hold Plavix  5 days also low risk cardiac issues. Please see note from [facility] Dr Ladona patient had his appointment today..Thanks    Proceed with Colon/EGD with Stacia?

## 2023-06-28 NOTE — Telephone Encounter (Signed)
 Ok I just seen your message after I sent mine, Is this ok in [facility]?

## 2023-06-28 NOTE — Progress Notes (Signed)
 Agree with the assessment and plan as outlined by Alcide Evener, NP.    Zae Kirtz E. Tomasa Rand, MD [facility]

## 2023-06-28 NOTE — Progress Notes (Signed)
 Sean Weaver, cardiac clearance received. You previously obtained Plavix  clearance from his neurologist. Dr. Ladona also ok with Plavix  5 day hold. Please contact patient and schedule him for an EGD and colonoscopy with Dr. Stacia in [facility] and let him know Plavix  5 day hold instructions. THX.

## 2023-06-28 NOTE — Progress Notes (Signed)
 Cardiology Office Note:  .   Date:  06/28/2023  ID:  Sean Weaver, DOB 12-Dec-1955, MRN [id number] PCP: Weaver Sean PARAS, MD  [facility] Providers Cardiologist:  Gordy Bergamo, MD   History of Present Illness: .   Sean Weaver is a 68 y.o. Caucasian male patient with hypertension, hypercholesterolemia, tobacco use disorder, history of stroke in January 2020 and has had loop recorder implanted at that time which revealed no atrial fibrillation episodes, symptoms felt to be due to cryptogenic stroke and presently on long-term Plavix .  His last loop recorder transmission was in January 2024 and has reached end-of-life, no atrial fibrillation episodes.  Patient referred for preoperative cardiac risk stratification as he needs GI workup due to dysphagia and during office visit with Dr. Ronal Sean Weaver, his PCP on 06/14/2023 , patient noted to have elevated heart rate of 132 bpm without any symptoms.    Discussed the use of AI scribe software for clinical note transcription with the patient, who gave verbal consent to proceed.  History of Present Illness Sean Weaver is a 68 year old male with hypertension who presents for preoperative evaluation for colonoscopy and endoscopy. He was referred by Dr. Ronal Sean Weaver for evaluation due to elevated heart rate and preoperative clearance.  He has hypertension managed with ramipril , with home blood pressure readings around 124/80. His heart rate is elevated, recently recorded at 132, with previous EKGs showing rates in the 90s to 110s. He denies chest pain, shortness of breath, or leg cramping.  He experienced a cryptogenic stroke in 2020, with a loop recorder placed subsequently. The loop recorder showed no atrial fibrillation episodes until it reached end of life in February 2024. The stroke involved symptoms of peripheral vision loss, and MRI indicated occipital region strokes.  He smokes 14 cigarettes daily and consumes a couple of  alcoholic drinks each night. He walks extensively at work, averaging 12,000 steps on weekdays and up to 17,000 steps on Saturdays, without additional physical activities.   Labs   Lab Results  Component Value Date   CHOL 184 03/22/2023   HDL 83 03/22/2023   LDLCALC 85 03/22/2023   TRIG 74 03/22/2023   CHOLHDL 2.2 03/22/2023   Lab Results  Component Value Date   NA 138 03/22/2023   K 5.2 03/22/2023   CO2 26 03/22/2023   GLUCOSE 85 03/22/2023   BUN 15 03/22/2023   CREATININE 1.16 03/22/2023   CALCIUM  9.4 03/22/2023   EGFR 59 (L) 03/16/2022   GFRNONAA 58 (L) 02/19/2020      Latest Ref Rng & Units 03/22/2023   12:34 PM 06/22/2022    9:55 AM 03/16/2022   10:17 AM  BMP  Glucose 65 - 99 mg/dL 85  97  98   BUN 7 - 25 mg/dL 15  12  19    Creatinine 0.70 - 1.35 mg/dL 8.83  8.85  8.67   BUN/Creat Ratio 6 - 22 (calc) SEE NOTE:  SEE NOTE:  SEE NOTE:   Sodium 135 - 146 mmol/L 138  139  140   Potassium 3.5 - 5.3 mmol/L 5.2  4.4  5.1   Chloride 98 - 110 mmol/L 102  104  104   CO2 20 - 32 mmol/L 26  28  27    Calcium  8.6 - 10.3 mg/dL 9.4  9.4  9.7       Latest Ref Rng & Units 03/22/2023   12:34 PM 03/16/2022   10:17 AM 09/01/2021  9:15 AM  CBC  WBC 3.8 - 10.8 Thousand/uL 8.1  6.4  6.4   Hemoglobin 13.2 - 17.1 g/dL 84.1  84.2  84.1   Hematocrit 38.5 - 50.0 % 47.1  46.2  47.1   Platelets 140 - 400 Thousand/uL 332  284  270    Lab Results  Component Value Date   HGBA1C 5.4 08/12/2020    Lab Results  Component Value Date   TSH 0.95 03/22/2023     ROS  Review of Systems  Cardiovascular:  Negative for chest pain, dyspnea on exertion and leg swelling.    Physical Exam:   VS:  BP (!) 148/76 (BP Location: Left Arm, Patient Position: Sitting, Cuff Size: Normal)   Pulse 90   Resp 16   Ht 5' 5 (1.651 m)   Wt 127 lb 12.8 oz (58 kg)   SpO2 96%   BMI 21.27 kg/m    Wt Readings from Last 3 Encounters:  06/28/23 127 lb 12.8 oz (58 kg)  06/14/23 131 lb 6 oz (59.6 kg)  03/29/23  131 lb 6.4 oz (59.6 kg)    Physical Exam Neck:     Vascular: No carotid bruit or JVD.   Cardiovascular:     Rate and Rhythm: Normal rate and regular rhythm.     Pulses:          Dorsalis pedis pulses are 1+ on the right side and 0 on the left side.       Posterior tibial pulses are 1+ on the right side and 0 on the left side.     Heart sounds: Normal heart sounds. No murmur heard.    No gallop.  Pulmonary:     Effort: Pulmonary effort is normal.     Breath sounds: Normal breath sounds.  Abdominal:     General: Bowel sounds are normal.     Palpations: Abdomen is soft.   Musculoskeletal:     Right lower leg: No edema.     Left lower leg: No edema.   Skin:    Capillary Refill: Capillary refill takes less than 2 seconds.    Studies Reviewed: SABRA    Coronary calcium  score 10/16/2022: Total score 172 in the 55th percentile.  Aortic atherosclerosis.  [employer] - ILR - Carelink EOL.  Last transmission February 2024, no episodes of atrial fibrillation since implantation for cryptogenic stroke in 2020.  Echocardiogram 04/12/18: 1. The left ventricle has normal systolic function, with an ejection fraction of 55-60%. The cavity size was normal. Left ventricular diastolic Doppler parameters are consistent with impaired relaxation. No evidence of left ventricular regional wall motion abnormalities. 2. The right ventricle has normal systolic function. The cavity was normal. There is no increase in right ventricular wall thickness. 3. The aortic valve is tricuspid. No stenosis of the aortic valve. 4. The aortic root is normal in size and structure. 5. Negative bubble study. 6. In certain views, there is concern for a right atrial mass. However, I think that this is probably a Eustachian valve or enfolding of the right atrial wall. 7. No evidence of mitral valve stenosis. No mitral regurgitation. 8. Normal IVC size. No complete TR doppler jet so unable to estimate PA systolic pressure.  EKG:     EKG Interpretation Date/Time:  Monday June 28 2023 11:51:34 EDT Ventricular Rate:  97 PR Interval:  170 QRS Duration:  80 QT Interval:  350 QTC Calculation: 444 R Axis:   88  Text Interpretation: EKG 06/28/2023: Normal sinus  rhythm with rate of 97 bpm, normal EKG.  Compared to 02/20/2019, sinus tachycardia heart rate of 110 bpm otherwise no change. Confirmed by Brooklyn Alfredo, Jagadeesh [phone]) on 06/28/2023 11:55:46 AM  EKG 02/20/2019: Sinus tachycardia at rate 110 bpm otherwise normal EKG.  Medications ordered    Meds ordered this encounter  Medications   diltiazem (CARDIZEM CD) 180 MG 24 hr capsule    Sig: Take 1 capsule (180 mg total) by mouth daily.    Dispense:  90 capsule    Refill:  0   ezetimibe (ZETIA) 10 MG tablet    Sig: Take 1 tablet (10 mg total) by mouth daily.    Dispense:  90 tablet    Refill:  0     ASSESSMENT AND PLAN: .      ICD-10-CM   1. Preoperative cardiovascular examination  Z01.810     2. Cryptogenic stroke (HCC)  I63.9 EKG 12-Lead    ezetimibe (ZETIA) 10 MG tablet    3. Primary hypertension  I10 diltiazem (CARDIZEM CD) 180 MG 24 hr capsule    4. Hypercholesteremia  E78.00 ezetimibe (ZETIA) 10 MG tablet    5. Tobacco abuse  Z72.0      Assessment & Plan Primary hypertension Blood pressure is 148/76 mmHg, with a target of less than 130/80 mmHg. Elevated heart rate, smoking, and possible excess caffeine intake may contribute to hypertension. Diltiazem CD 180 mg once daily is prescribed to manage blood pressure and heart rate. - Prescribe diltiazem CD 180 mg once daily to manage blood pressure and heart rate - Follow up with Dr. Ronal Rush Weaver for blood pressure and heart rate management  Resting sinus tachycardia Heart rate consistently elevated in the 90s to 100s. Smoking contributes to elevated heart rate, which may indicate poor cardiovascular conditioning. Diltiazem CD 180 mg once daily is prescribed to manage heart rate. - Prescribe diltiazem CD  180 mg once daily to manage heart rate - Follow up with Dr. Ronal Rush Weaver for heart rate management  Cryptogenic stroke in 2020 Cryptogenic stroke in 2020 with no atrial fibrillation detected by loop recorder. High risk for future cardiovascular events. Smoking cessation is strongly advised to reduce stroke risk. - Advise to quit smoking to reduce risk of future strokes - Address HLD  Hypercholesterolemia Cholesterol is well-controlled with LDL at 85 mg/dL, but target LDL is less than 70 mg/dL due to stroke history. Mild aortic and carotid atherosclerosis indicates need for further cholesterol management. Zetia is prescribed in addition to current statin therapy to lower LDL to <70. - Prescribe Zetia in addition to current statin therapy to lower LDL - Follow up with Dr. 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 Coronary calcium  score in the 50th percentile indicates average risk. Smoking cessation and lifestyle modifications are crucial to prevent progression. Regular walking and exercise are encouraged to improve cardiovascular conditioning. - Advise to quit smoking to prevent progression of atherosclerosis - Encourage lifestyle modifications, including regular walking and exercise  Tobacco use disorder Continues to smoke approximately 14 cigarettes per day. Smoking is a significant risk factor for cardiovascular disease and contributes to elevated heart rate and hypertension. Strongly advised to quit smoking to reduce cardiovascular risk and improve overall health. - Strongly advise to quit smoking to reduce cardiovascular risk - Discuss benefits of smoking cessation for overall health  Mild hiatal hernia/maintenance colonoscopy and EGD/preop-preprocedure cardiac risk evaluation Mild hiatal hernia diagnosed by GI doctors. Scheduled  for endoscopy and colonoscopy for further evaluation. Plavix  should be  held for 5 days prior to procedures.  Low risk from cardiac standpoint. - Hold Plavix  for 5 days prior to endoscopy and colonoscopy - Send preoperative clearance note to GI team   Signed,  Gordy Bergamo, MD, [facility] 06/28/2023, 12:18 PM [facility] [address] [place], [place] [address] Phone: [phone]. Fax:  [phone]

## 2023-06-28 NOTE — Patient Instructions (Addendum)
 Medication Instructions:  Your physician has recommended you make the following change in your medication:  Start Zetia 10 mg by mouth daily  Start Cardizem CD 180 mg by mouth daily   *If you need a refill on your cardiac medications before your next appointment, please call your pharmacy*  Lab Work: none If you have labs (blood work) drawn today and your tests are completely normal, you will receive your results only by: MyChart Message (if you have MyChart) OR A paper copy in the mail If you have any lab test that is abnormal or we need to change your treatment, we will call you to review the results.  Testing/Procedures: none  Follow-Up: At [facility], you and your health needs are our priority.  As part of our continuing mission to provide you with exceptional heart care, our providers are all part of one team.  This team includes your primary Cardiologist (physician) and Advanced Practice Providers or APPs (Physician Assistants and Nurse Practitioners) who all work together to provide you with the care you need, when you need it.  Your next appointment:   As needed  Provider:   Gordy Bergamo, MD    We recommend signing up for the patient portal called MyChart.  Sign up information is provided on this After Visit Summary.  MyChart is used to connect with patients for Virtual Visits (Telemedicine).  Patients are able to view lab/test results, encounter notes, upcoming appointments, etc.  Non-urgent messages can be sent to your provider as well.   To learn more about what you can do with MyChart, go to [email or web address].   Other Instructions

## 2023-06-29 NOTE — Telephone Encounter (Signed)
 Robin, yes, please schedule EGD and colonoscopy with Dr. Stacia in [facility] thank you.

## 2023-06-29 NOTE — Telephone Encounter (Signed)
 See other phone note will schedule colon EGD in [facility] pt cleared

## 2023-06-30 NOTE — Telephone Encounter (Signed)
 Called patient to schedule EGD/Colon but had to leave a message. He does not need a pre-op was just seen in the office

## 2023-07-01 ENCOUNTER — Encounter: Payer: Self-pay | Admitting: *Deleted

## 2023-07-01 MED ORDER — NA SULFATE-K SULFATE-MG SULF 17.5-3.13-1.6 GM/177ML PO SOLN
1.0000 | Freq: Once | ORAL | 0 refills | Status: AC
Start: 2023-07-01 — End: 2023-07-01

## 2023-07-01 NOTE — Telephone Encounter (Signed)
 Printed instructions put in amb referral also sent in Suprep to patients pharmacy.   Called patient to inform, he said he would look at the instructions in My Chart also went over with him holding his Plavix  5 days before his procedure . Patient verbally understood his instructions and will call back if he has any questions

## 2023-07-01 NOTE — Telephone Encounter (Signed)
 Patient has been scheduled for Endo/Colon on 8/11. Please advise, thank you

## 2023-07-18 ENCOUNTER — Ambulatory Visit
Admission: RE | Admit: 2023-07-18 | Discharge: 2023-07-18 | Disposition: A | Discharge status: Home / Self Care | Source: Ambulatory Visit | Attending: Internal Medicine | Admitting: Internal Medicine

## 2023-07-18 VITALS — BP 135/77 | HR 94 | Temp 98.4°F | Resp 16 | Wt 127.9 lb

## 2023-07-18 DIAGNOSIS — L270 Generalized skin eruption due to drugs and medicaments taken internally: Secondary | ICD-10-CM | POA: Diagnosis not present

## 2023-07-18 MED ORDER — PREDNISONE 20 MG PO TABS: 40.0000 mg | ORAL_TABLET | Freq: Every day | ORAL | 0 refills | Status: AC

## 2023-07-18 MED ORDER — METHYLPREDNISOLONE SODIUM SUCC 40 MG IJ SOLR
40.0000 mg | Freq: Once | INTRAMUSCULAR | Status: AC
  Administered 2023-07-18: 40 mg via INTRAMUSCULAR

## 2023-07-18 NOTE — ED Triage Notes (Signed)
 Started 2 new meds today. Ditiazem 180 MG and Ezetimbe 10 mg - Entered by patient

## 2023-07-18 NOTE — ED Provider Notes (Signed)
 [facility]    CSN: [id number] Arrival date & time: 07/18/23  1050      History   Chief Complaint Chief Complaint  Patient presents with   Rash    Started 2 new meds today. Ditiazem 180 MG and Ezetimbe 10 mg - Entered by patient    HPI Sean Weaver is a 68 y.o. male.   68 year old male who presents to urgent care with possible drug allergic reaction.  The patient reports that this morning he started 2 new medications, diltiazem  and ezetimibe .  Today was the first day that he is taking them.  Within about 30 minutes of taking the medication he began develop a rash that has continued to worsen.  The rash is diffuse on his arms chest and back.  He denies any facial swelling, lip swelling, mouth swelling, tongue swelling, throat swelling, difficulty swallowing or shortness of breath.  He has never had a reaction to the medications like this in the past.  He has not sent a message to his prescribing physician's office yet.   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) 02/19/2018   Adenomatous polyp 05/03/2011   History of smoking 05/03/2011   GE reflux 05/03/2011   Hepatitis B antibody positive 05/03/2011   Psoriasis 05/03/2011   History of psoriatic arthritis 05/03/2011   Erectile dysfunction 05/03/2011   Hyperlipidemia 08/25/2010   Hypothyroidism 08/25/2010   Hypertension 08/25/2010    Past Surgical History:  Procedure  Laterality Date   dental procedure     removed tooth, will receive implant    implantable loop recorder placement  07/15/2018   [employer] Reveal Linq model [id number] implantable loop recorder (SN [id number])       Home Medications    Prior to Admission medications   Medication Sig Start Date End Date Taking? Authorizing Provider  buPROPion  (WELLBUTRIN  XL) 150 MG 24 hr tablet TAKE 1 TABLET BY MOUTH DAILY 02/25/23  Yes Baxley, Ronal PARAS, MD  cetirizine (ZYRTEC) 10 MG tablet Take 10 mg by mouth daily. 06/20/18  Yes [provider]  clopidogrel  (PLAVIX ) 75 MG tablet Take 1 tablet (75 mg total) by mouth daily. 06/10/23  Yes Ines Onetha NOVAK, MD  diltiazem  (CARDIZEM  CD) 180 MG 24 hr capsule Take 1 capsule (180 mg total) by mouth daily. 06/28/23 09/26/23 Yes Ladona Heinz, MD  ezetimibe  (ZETIA ) 10 MG tablet Take 1 tablet (10 mg total) by mouth daily. 06/28/23 09/26/23 Yes Ladona Heinz, MD  predniSONE  (DELTASONE ) 20 MG tablet Take 2 tablets (40 mg total) by mouth daily with breakfast for 5 days. 07/18/23 07/23/23 Yes Sade Mehlhoff A, PA-C  rosuvastatin  (CRESTOR ) 20 MG tablet TAKE 1 TABLET BY MOUTH DAILY 04/07/23  Yes Baxley, Ronal PARAS, MD  SYNTHROID  100 MCG tablet TAKE 1 TABLET BY MOUTH DAILY 06/08/23  Yes Baxley, Ronal PARAS, MD  Adalimumab (HUMIRA) 40 MG/0.4ML PSKT Inject into the  skin. EVERY OTHER WEEK    [provider]  guaiFENesin (MUCINEX PO) Take 1 tablet by mouth as directed.    [provider]  ramipril  (ALTACE ) 10 MG capsule TAKE 1 CAPSULE BY MOUTH DAILY 06/08/23   Perri Ronal PARAS, MD  sildenafil  (VIAGRA ) 50 MG tablet TAKE 1 TABLET BY MOUTH DAILY AS NEEDED FOR ERECTILE DYSFUNCTION 06/25/23   Perri Ronal PARAS, MD  sildenafil  (VIAGRA ) 50 MG tablet Take 1 tablet (50 mg total) by mouth daily as needed for erectile dysfunction. 06/25/23   Perri Ronal PARAS, MD  triamcinolone  cream (KENALOG ) 0.1 % Apply topically 2 (two) times daily. 05/10/22   Ines Onetha NOVAK, MD    Family History Family History   Problem Relation Age of Onset   Ovarian cancer Mother    Breast cancer Mother    Prostate cancer Father    Kidney failure Father    Colon polyps Father    Hypertension Father    Hyperlipidemia Father    Hypertension Sister    Non-Hodgkin's lymphoma Sister    COPD Maternal Grandfather    Migraines Neg Hx    Headache Neg Hx     Social History Social History   Tobacco Use   Smoking status: Every Day    Types: Cigarettes   Smokeless tobacco: Never   Tobacco comments:    14 cigarettes per day  Vaping Use   Vaping status: Former   Substances: Nicotine  Substance Use Topics   Alcohol use: Yes    Alcohol/week: 14.0 standard drinks of alcohol    Types: 14 Standard drinks or equivalent per week    Comment: 2 DRINKS PER DAY   Drug use: Yes    Frequency: 4.0 times per week    Types: Marijuana    Comment: light usage     Allergies   Aspirin   Review of Systems Review of Systems  Constitutional:  Negative for chills and fever.  HENT:  Negative for ear pain and sore throat.   Eyes:  Negative for pain and visual disturbance.  Respiratory:  Negative for cough, chest tightness and shortness of breath.   Cardiovascular:  Negative for chest pain and palpitations.  Gastrointestinal:  Negative for abdominal pain and vomiting.  Genitourinary:  Negative for dysuria and hematuria.  Musculoskeletal:  Negative for arthralgias and back pain.  Skin:  Positive for rash. Negative for color change.  Neurological:  Negative for seizures and syncope.  All other systems reviewed and are negative.    Physical Exam Triage Vital Signs ED Triage Vitals  Encounter Vitals Group     BP 07/18/23 1108 135/77     Girls Systolic BP Percentile --      Girls Diastolic BP Percentile --      Boys Systolic BP Percentile --      Boys Diastolic BP Percentile --      Pulse Rate 07/18/23 1108 94     Resp 07/18/23 1108 16     Temp 07/18/23 1108 98.4 F (36.9 C)     Temp Source 07/18/23 1108 Oral      SpO2 07/18/23 1108 97 %     Weight 07/18/23 1107 127 lb 13.9 oz (58 kg)     Height --      Head Circumference --      Peak Flow --      Pain Score 07/18/23 1107 0     Pain Loc --      Pain Education --  Exclude from Growth Chart --    No data found.  Updated Vital Signs BP 135/77 (BP Location: Left Arm)   Pulse 94   Temp 98.4 F (36.9 C) (Oral)   Resp 16   Wt 127 lb 13.9 oz (58 kg)   SpO2 97%   BMI 21.28 kg/m   Visual Acuity Right Eye Distance:   Left Eye Distance:   Bilateral Distance:    Right Eye Near:   Left Eye Near:    Bilateral Near:     Physical Exam Vitals and nursing note reviewed.  Constitutional:      General: He is not in acute distress.    Appearance: He is well-developed.  HENT:     Head: Normocephalic and atraumatic.     Mouth/Throat:     Mouth: Mucous membranes are moist.     Pharynx: Oropharynx is clear. No pharyngeal swelling, posterior oropharyngeal erythema or uvula swelling.  Eyes:     Conjunctiva/sclera: Conjunctivae normal.  Cardiovascular:     Rate and Rhythm: Normal rate and regular rhythm.     Heart sounds: No murmur heard. Pulmonary:     Effort: Pulmonary effort is normal. No respiratory distress.     Breath sounds: Normal breath sounds.  Abdominal:     Palpations: Abdomen is soft.     Tenderness: There is no abdominal tenderness.  Musculoskeletal:        General: No swelling.     Cervical back: Neck supple.  Skin:    General: Skin is warm and dry.     Capillary Refill: Capillary refill takes less than 2 seconds.     Findings: Rash present. Rash is macular and urticarial.     Comments: Diffuse drug eruption type reaction on the arms, chest and back.  No swelling of the oropharynx is present  Neurological:     Mental Status: He is alert.  Psychiatric:        Mood and Affect: Mood normal.      UC Treatments / Results  Labs (all labs ordered are listed, but only abnormal results are displayed) Labs Reviewed - No  data to display  EKG   Radiology No results found.  Procedures Procedures (including critical care time)  Medications Ordered in UC Medications  methylPREDNISolone  sodium succinate (SOLU-MEDROL ) 40 mg/mL injection 40 mg (40 mg Intramuscular Given 07/18/23 1150)  methylPREDNISolone  sodium succinate (SOLU-MEDROL ) 40 mg/mL injection 40 mg (40 mg Intramuscular Given 07/18/23 1147)    Initial Impression / Assessment and Plan / UC Course  I have reviewed the triage vital signs and the nursing notes.  Pertinent labs & imaging results that were available during my care of the patient were reviewed by me and considered in my medical decision making (see chart for details).     Allergic drug rash   Symptoms and physical exam findings are most consistent with an allergic reaction to a medication.  As you started 2 new medications today it would be difficult to distinguish which is the causative agent.  Given the quickness of the reaction as well as the diffuse rash, we recommend holding both medications and contacting this physician who has prescribed these as soon as possible.  We will treat for a drug allergic reaction with the following: Solu-Medrol  80 mg injection given today.  This is a steroid to help with allergic reaction Starting tomorrow July 14: Prednisone  40 mg (2 tablets) once daily for 5 days. Take this in the morning.  This is a  steroid to help with allergic reaction Contact your physician who prescribes your medications as soon as possible to discuss holding these medications and when it would be appropriate to restart If you develop shortness of breath, swelling of the lips, tongue, mouth, throat or any difficulty breathing then recommend going to the emergency room immediately for further evaluation May use triamcinolone  cream on the rash on the arms trunk and back if needed for itching.  Do not apply triamcinolone  to the face or neck. Return to urgent care as needed  Final  Clinical Impressions(s) / UC Diagnoses   Final diagnoses:  Allergic drug rash     Discharge Instructions      Symptoms and physical exam findings are most consistent with an allergic reaction to a medication.  As you started 2 new medications today it would be difficult to distinguish which is the causative agent.  Given the quickness of the reaction as well as the diffuse rash, we recommend holding both medications and contacting this physician who has prescribed these as soon as possible.  We will treat for a drug allergic reaction with the following: Solu-Medrol  80 mg injection given today.  This is a steroid to help with allergic reaction Starting tomorrow July 14: Prednisone  40 mg (2 tablets) once daily for 5 days. Take this in the morning.  This is a steroid to help with allergic reaction Contact your physician who prescribes your medications as soon as possible to discuss holding these medications and when it would be appropriate to restart If you develop shortness of breath, swelling of the lips, tongue, mouth, throat or any difficulty breathing then recommend going to the emergency room immediately for further evaluation May use triamcinolone  cream on the rash on the arms trunk and back if needed for itching.  Do not apply triamcinolone  to the face or neck. Return to urgent care as needed    ED Prescriptions     Medication Sig Dispense Auth. Provider   predniSONE  (DELTASONE ) 20 MG tablet Take 2 tablets (40 mg total) by mouth daily with breakfast for 5 days. 10 tablet Teresa Almarie LABOR, [place]      PDMP not reviewed this encounter.   Teresa Almarie LABOR, PA-C 07/18/23 1153

## 2023-07-18 NOTE — Discharge Instructions (Addendum)
 Symptoms and physical exam findings are most consistent with an allergic reaction to a medication.  As you started 2 new medications today it would be difficult to distinguish which is the causative agent.  Given the quickness of the reaction as well as the diffuse rash, we recommend holding both medications and contacting this physician who has prescribed these as soon as possible.  We will treat for a drug allergic reaction with the following: Solu-Medrol  80 mg injection given today.  This is a steroid to help with allergic reaction Starting tomorrow July 14: Prednisone  40 mg (2 tablets) once daily for 5 days. Take this in the morning.  This is a steroid to help with allergic reaction Contact your physician who prescribes your medications as soon as possible to discuss holding these medications and when it would be appropriate to restart If you develop shortness of breath, swelling of the lips, tongue, mouth, throat or any difficulty breathing then recommend going to the emergency room immediately for further evaluation May use triamcinolone  cream on the rash on the arms trunk and back if needed for itching.  Do not apply triamcinolone  to the face or neck. Return to urgent care as needed

## 2023-07-19 ENCOUNTER — Encounter: Payer: Self-pay | Admitting: Cardiology

## 2023-08-09 ENCOUNTER — Encounter: Payer: Self-pay | Admitting: Gastroenterology

## 2023-08-16 ENCOUNTER — Ambulatory Visit: Admitting: Gastroenterology

## 2023-08-16 ENCOUNTER — Encounter: Payer: Self-pay | Admitting: Gastroenterology

## 2023-08-16 VITALS — BP 141/79 | HR 90 | Temp 98.2°F | Resp 20 | Ht 65.0 in | Wt 131.0 lb

## 2023-08-16 DIAGNOSIS — K222 Esophageal obstruction: Secondary | ICD-10-CM | POA: Diagnosis not present

## 2023-08-16 DIAGNOSIS — Z1211 Encounter for screening for malignant neoplasm of colon: Secondary | ICD-10-CM

## 2023-08-16 DIAGNOSIS — D123 Benign neoplasm of transverse colon: Secondary | ICD-10-CM

## 2023-08-16 DIAGNOSIS — D125 Benign neoplasm of sigmoid colon: Secondary | ICD-10-CM

## 2023-08-16 DIAGNOSIS — J387 Other diseases of larynx: Secondary | ICD-10-CM

## 2023-08-16 DIAGNOSIS — K635 Polyp of colon: Secondary | ICD-10-CM | POA: Diagnosis not present

## 2023-08-16 DIAGNOSIS — Z860100 Personal history of colon polyps, unspecified: Secondary | ICD-10-CM

## 2023-08-16 DIAGNOSIS — K449 Diaphragmatic hernia without obstruction or gangrene: Secondary | ICD-10-CM

## 2023-08-16 DIAGNOSIS — R1319 Other dysphagia: Secondary | ICD-10-CM

## 2023-08-16 DIAGNOSIS — K5730 Diverticulosis of large intestine without perforation or abscess without bleeding: Secondary | ICD-10-CM | POA: Diagnosis not present

## 2023-08-16 DIAGNOSIS — K6389 Other specified diseases of intestine: Secondary | ICD-10-CM | POA: Diagnosis not present

## 2023-08-16 DIAGNOSIS — Z860101 Personal history of adenomatous and serrated colon polyps: Secondary | ICD-10-CM

## 2023-08-16 DIAGNOSIS — D124 Benign neoplasm of descending colon: Secondary | ICD-10-CM

## 2023-08-16 MED ORDER — SODIUM CHLORIDE 0.9 % IV SOLN: 500.0000 mL | Freq: Once | INTRAVENOUS | Status: DC

## 2023-08-16 NOTE — Progress Notes (Signed)
 Called to room to assist during endoscopic procedure.  Patient ID and intended procedure confirmed with present staff. Received instructions for my participation in the procedure from the performing physician.

## 2023-08-16 NOTE — Op Note (Signed)
 [facility] Patient Name: Sean Weaver Procedure Date: 08/16/2023 11:36 AM MRN: [id number] Endoscopist: Glendia E. Stacia , MD, [id number] Age: 68 Referring MD:  Date of Birth: [birth year] Gender: Male Account #: [id number] Procedure:                Colonoscopy Indications:              High risk colon cancer surveillance: Personal                            history of adenoma with villous component Medicines:                Monitored Anesthesia Care Procedure:                Pre-Anesthesia Assessment:                           - Prior to the procedure, a History and Physical                            was performed, and patient medications and                            allergies were reviewed. The patient's tolerance of                            previous anesthesia was also reviewed. The risks                            and benefits of the procedure and the sedation                            options and risks were discussed with the patient.                            All questions were answered, and informed consent                            was obtained. Prior Anticoagulants: The patient has                            taken Plavix  (clopidogrel ), last dose was 6 days                            prior to procedure. ASA Grade Assessment: III - A                            patient with severe systemic disease. After                            reviewing the risks and benefits, the patient was                            deemed in satisfactory condition to undergo the  procedure.                           After obtaining informed consent, the colonoscope                            was passed under direct vision. Throughout the                            procedure, the patient's blood pressure, pulse, and                            oxygen saturations were monitored continuously. The                            Olympus Scope [id number] was introduced  through the                            anus and advanced to the the cecum, identified by                            appendiceal orifice and ileocecal valve. The                            colonoscopy was performed without difficulty. The                            patient tolerated the procedure well. The quality                            of the bowel preparation was adequate. The                            ileocecal valve, appendiceal orifice, and rectum                            were photographed. The bowel preparation used was                            SUPREP via split dose instruction. Scope In: 12:41:38 PM Scope Out: 1:04:49 PM Scope Withdrawal Time: 0 hours 19 minutes 26 seconds  Total Procedure Duration: 0 hours 23 minutes 11 seconds  Findings:                 The perianal and digital rectal examinations were                            normal. Pertinent negatives include normal                            sphincter tone and no palpable rectal lesions.                           A 7 mm polyp was found in the transverse colon. The  polyp was sessile. The polyp was removed with a                            cold snare. Resection and retrieval were complete.                            Estimated blood loss was minimal.                           A 4 mm polyp was found in the splenic flexure. The                            polyp was sessile. The polyp was removed with a                            cold snare. Resection and retrieval were complete.                            Estimated blood loss was minimal.                           A 5 mm polyp was found in the descending colon. The                            polyp was sessile. The polyp was removed with a                            cold snare. Resection and retrieval were complete.                            Estimated blood loss was minimal.                           A 4 mm polyp was found in the sigmoid colon.  The                            polyp was sessile. The polyp was removed with a                            cold snare. Resection and retrieval were complete.                            Estimated blood loss was minimal.                           A few small-mouthed diverticula were found in the                            sigmoid colon.                           The exam was otherwise normal throughout the  examined colon.                           The retroflexed view of the distal rectum and anal                            verge was normal and showed no anal or rectal                            abnormalities. Complications:            No immediate complications. Estimated Blood Loss:     Estimated blood loss was minimal. Impression:               - One 7 mm polyp in the transverse colon, removed                            with a cold snare. Resected and retrieved.                           - One 4 mm polyp at the splenic flexure, removed                            with a cold snare. Resected and retrieved.                           - One 5 mm polyp in the descending colon, removed                            with a cold snare. Resected and retrieved.                           - One 4 mm polyp in the sigmoid colon, removed with                            a cold snare. Resected and retrieved.                           - Mild diverticulosis in the sigmoid colon.                           - The distal rectum and anal verge are normal on                            retroflexion view. Recommendation:           - Patient has a contact number available for                            emergencies. The signs and symptoms of potential                            delayed complications were discussed with the  patient. Return to normal activities tomorrow.                            Written discharge instructions were provided to the                             patient.                           - Resume previous diet.                           - Continue present medications.                           - Resume Plavix  (clopidogrel ) at prior dose                            tomorrow.                           - Await pathology results.                           - Repeat colonoscopy (date not yet determined) for                            surveillance based on pathology results. Asad Keeven E. Stacia, MD 08/16/2023 1:21:17 PM This report has been signed electronically.

## 2023-08-16 NOTE — Op Note (Signed)
 [facility] Patient Name: Sean Weaver Procedure Date: 08/16/2023 12:18 PM MRN: [id number] Endoscopist: Glendia E. Stacia , MD, [id number] Age: 68 Referring MD:  Date of Birth: [birth year] Gender: Male Account #: [id number] Procedure:                Upper GI endoscopy Indications:              Dysphagia Medicines:                Monitored Anesthesia Care Procedure:                Pre-Anesthesia Assessment:                           - Prior to the procedure, a History and Physical                            was performed, and patient medications and                            allergies were reviewed. The patient's tolerance of                            previous anesthesia was also reviewed. The risks                            and benefits of the procedure and the sedation                            options and risks were discussed with the patient.                            All questions were answered, and informed consent                            was obtained. Prior Anticoagulants: The patient has                            taken Plavix  (clopidogrel ), last dose was 6 days                            prior to procedure. ASA Grade Assessment: III - A                            patient with severe systemic disease. After                            reviewing the risks and benefits, the patient was                            deemed in satisfactory condition to undergo the                            procedure.  After obtaining informed consent, the endoscope was                            passed under direct vision. Throughout the                            procedure, the patient's blood pressure, pulse, and                            oxygen saturations were monitored continuously. The                            GIF [id number] was introduced through the                            mouth, and advanced to the second part of duodenum.                             The upper GI endoscopy was accomplished without                            difficulty. The patient tolerated the procedure                            well. Scope In: Scope Out: Findings:                 A medium sized non-obstructing nodule vs cyst was                            found on the left arytenoid.                           A non-obstructing Schatzki ring was found at the                            gastroesophageal junction. A guidewire was placed                            and the scope was withdrawn. Dilation was performed                            with a Savary dilator with no resistance at 18 mm.                            The dilation site was examined following endoscope                            reinsertion and showed no change.                           The exam of the esophagus was otherwise normal.                           A  3 cm hiatal hernia was present.                           The entire examined stomach was normal.                           The examined duodenum was normal. Complications:            No immediate complications. Estimated Blood Loss:     Estimated blood loss was minimal. Impression:               - A nodule was found on the left arytenoid. This                            lesion is benign in appearance.                           - Non-obstructing Schatzki ring. Dilated.                           - 3 cm hiatal hernia.                           - Normal stomach.                           - Normal examined duodenum.                           - No specimens collected. Recommendation:           - Patient has a contact number available for                            emergencies. The signs and symptoms of potential                            delayed complications were discussed with the                            patient. Return to normal activities tomorrow.                            Written discharge instructions were provided to the                             patient.                           - Resume previous diet.                           - Resume Plavix  (clopidogrel ) at prior dose                            tomorrow.                           -  Follow up per ENT for vocal cord lesion.                           - If no improvement in swallowing following                            dilation, can consider repeat dilation with balloon. Marian Meneely E. Stacia, MD 08/16/2023 1:16:08 PM This report has been signed electronically.

## 2023-08-16 NOTE — Patient Instructions (Addendum)
 Resume previous diet Continue present medications Await pathology results  Handouts/information given for polyps, diverticulosis   YOU HAD AN ENDOSCOPIC PROCEDURE TODAY AT THE [facility]:   Refer to the procedure report that was given to you for any specific questions about what was found during the examination.  If the procedure report does not answer your questions, please call your gastroenterologist to clarify.  If you requested that your care partner not be given the details of your procedure findings, then the procedure report has been included in a sealed envelope for you to review at your convenience later.  YOU SHOULD EXPECT: Some feelings of bloating in the abdomen. Passage of more gas than usual.  Walking can help get rid of the air that was put into your GI tract during the procedure and reduce the bloating. If you had a lower endoscopy (such as a colonoscopy or flexible sigmoidoscopy) you may notice spotting of blood in your stool or on the toilet paper. If you underwent a bowel prep for your procedure, you may not have a normal bowel movement for a few days.  Please Note:  You might notice some irritation and congestion in your nose or some drainage.  This is from the oxygen used during your procedure.  There is no need for concern and it should clear up in a day or so.  SYMPTOMS TO REPORT IMMEDIATELY:  Following lower endoscopy (colonoscopy or flexible sigmoidoscopy):  Excessive amounts of blood in the stool  Significant tenderness or worsening of abdominal pains  Swelling of the abdomen that is new, acute  Fever of 100F or higher  Following upper endoscopy (EGD)  Vomiting of blood or coffee ground material  New chest pain or pain under the shoulder blades  Painful or persistently difficult swallowing  New shortness of breath  Fever of 100F or higher  Black, tarry-looking stools  For urgent or emergent issues, a gastroenterologist can be reached at any hour  by calling [phone]. Do not use MyChart messaging for urgent concerns.    DIET:  We do recommend a small meal at first, but then you may proceed to your regular diet.  Drink plenty of fluids but you should avoid alcoholic beverages for 24 hours.  ACTIVITY:  You should plan to take it easy for the rest of today and you should NOT DRIVE or use heavy machinery until tomorrow (because of the sedation medicines used during the test).    FOLLOW UP: Our staff will call the number listed on your records the next business day following your procedure.  We will call around 7:15- 8:00 am to check on you and address any questions or concerns that you may have regarding the information given to you following your procedure. If we do not reach you, we will leave a message.     If any biopsies were taken you will be contacted by phone or by letter within the next 1-3 weeks.  Please call us  at [phone] if you have not heard about the biopsies in 3 weeks.    SIGNATURES/CONFIDENTIALITY: You and/or your care partner have signed paperwork which will be entered into your electronic medical record.  These signatures attest to the fact that that the information above on your After Visit Summary has been reviewed and is understood.  Full responsibility of the confidentiality of this discharge information lies with you and/or your care-partner.

## 2023-08-16 NOTE — Progress Notes (Signed)
 Vss nad trans to pacu

## 2023-08-16 NOTE — Progress Notes (Signed)
 [facility] History and Physical   Primary Care Physician:  Perri Ronal PARAS, MD   Reason for Procedure:   Colon cancer screening/history of colon polyps/dysphagia  Plan:    EGD, colonoscopy     HPI: Sean Weaver is a 68 y.o. male undergoing surveillance colonoscopy.  He had a colonoscopy in 2007 in which a 10 mm TVA was removed from the sigmoid colon .  He has not had a colonoscopy since then.  He has no family history of colon cancer. He has dysphagia intermittently for the past year.  He underwent a barium swallow study with tablet 01/12/2023 which identified a small sliding hiatal hernia without esophageal stricture or reflux. He was seen by ENT and underwent laryngoscopy 12/21/2022 which showed a 2 cm cystic lesion on the left vallecula without obstruction and possible evidence of laryngeal reflux.   He takes Plavix  due to history of CVA.    Past Medical History:  Diagnosis Date   COPD (chronic obstructive pulmonary disease) (HCC)    GERD (gastroesophageal reflux disease)    resolved per pt report   Hyperlipidemia    Hypertension    Hypothyroidism    Pneumonia    Psoriatic arthritis (HCC)    Stroke (cerebrum) (HCC)    Tubular adenoma     Past Surgical History:  Procedure Laterality Date   dental procedure     removed tooth, will receive implant    implantable loop recorder placement  07/15/2018   Medtronic Reveal Linq model [id number] implantable loop recorder (SN [id number])    Prior to Admission medications   Medication Sig Start Date End Date Taking? Authorizing Provider  Adalimumab (HUMIRA) 40 MG/0.4ML PSKT Inject into the skin. EVERY OTHER WEEK   Yes [provider]  buPROPion  (WELLBUTRIN  XL) 150 MG 24 hr tablet TAKE 1 TABLET BY MOUTH DAILY 02/25/23  Yes Baxley, Ronal PARAS, MD  cetirizine (ZYRTEC) 10 MG tablet Take 10 mg by mouth daily. 06/20/18  Yes [provider]  guaiFENesin (MUCINEX PO) Take 1 tablet by mouth as directed.   Yes  [provider]  ramipril  (ALTACE ) 10 MG capsule TAKE 1 CAPSULE BY MOUTH DAILY 06/08/23  Yes Baxley, Ronal PARAS, MD  rosuvastatin  (CRESTOR ) 20 MG tablet TAKE 1 TABLET BY MOUTH DAILY 04/07/23  Yes Baxley, Ronal PARAS, MD  sildenafil  (VIAGRA ) 50 MG tablet TAKE 1 TABLET BY MOUTH DAILY AS NEEDED FOR ERECTILE DYSFUNCTION 06/25/23  Yes Baxley, Ronal PARAS, MD  sildenafil  (VIAGRA ) 50 MG tablet Take 1 tablet (50 mg total) by mouth daily as needed for erectile dysfunction. 06/25/23  Yes Baxley, Ronal PARAS, MD  SYNTHROID  100 MCG tablet TAKE 1 TABLET BY MOUTH DAILY 06/08/23  Yes Baxley, Ronal PARAS, MD  triamcinolone  cream (KENALOG ) 0.1 % Apply topically 2 (two) times daily. 05/10/22  Yes Ines Onetha NOVAK, MD  clopidogrel  (PLAVIX ) 75 MG tablet Take 1 tablet (75 mg total) by mouth daily. 06/10/23   Ines Onetha NOVAK, MD  diltiazem  (CARDIZEM  CD) 180 MG 24 hr capsule Take 1 capsule (180 mg total) by mouth daily. Patient not taking: Reported on 08/16/2023 06/28/23 09/26/23  Ladona Heinz, MD  ezetimibe  (ZETIA ) 10 MG tablet Take 1 tablet (10 mg total) by mouth daily. Patient not taking: Reported on 08/16/2023 06/28/23 09/26/23  Ladona Heinz, MD    Current Outpatient Medications  Medication Sig Dispense Refill   Adalimumab (HUMIRA) 40 MG/0.4ML PSKT Inject into the skin. EVERY OTHER WEEK     buPROPion  (WELLBUTRIN  XL) 150 MG 24  hr tablet TAKE 1 TABLET BY MOUTH DAILY 90 tablet 3   cetirizine (ZYRTEC) 10 MG tablet Take 10 mg by mouth daily.     guaiFENesin (MUCINEX PO) Take 1 tablet by mouth as directed.     ramipril  (ALTACE ) 10 MG capsule TAKE 1 CAPSULE BY MOUTH DAILY 100 capsule 2   rosuvastatin  (CRESTOR ) 20 MG tablet TAKE 1 TABLET BY MOUTH DAILY 100 tablet 2   sildenafil  (VIAGRA ) 50 MG tablet TAKE 1 TABLET BY MOUTH DAILY AS NEEDED FOR ERECTILE DYSFUNCTION 90 tablet 0   sildenafil  (VIAGRA ) 50 MG tablet Take 1 tablet (50 mg total) by mouth daily as needed for erectile dysfunction. 90 tablet 0   SYNTHROID  100 MCG tablet TAKE 1 TABLET BY MOUTH DAILY  100 tablet 2   triamcinolone  cream (KENALOG ) 0.1 % Apply topically 2 (two) times daily. 80 g 6   clopidogrel  (PLAVIX ) 75 MG tablet Take 1 tablet (75 mg total) by mouth daily. 90 tablet 4   diltiazem  (CARDIZEM  CD) 180 MG 24 hr capsule Take 1 capsule (180 mg total) by mouth daily. (Patient not taking: Reported on 08/16/2023) 90 capsule 0   ezetimibe  (ZETIA ) 10 MG tablet Take 1 tablet (10 mg total) by mouth daily. (Patient not taking: Reported on 08/16/2023) 90 tablet 0   Current Facility-Administered Medications  Medication Dose Route Frequency Provider Last Rate Last Admin   0.9 %  sodium chloride  infusion  500 mL Intravenous Once Stacia Glendia BRAVO, MD        Allergies as of 08/16/2023 - Review Complete 08/16/2023  Allergen Reaction Noted   Aspirin Swelling 08/15/2010    Family History  Problem Relation Age of Onset   Ovarian cancer Mother    Breast cancer Mother    Prostate cancer Father    Kidney failure Father    Colon polyps Father    Hypertension Father    Hyperlipidemia Father    Hypertension Sister    Non-Hodgkin's lymphoma Sister    COPD Maternal Grandfather    Migraines Neg Hx    Headache Neg Hx     Social History   Socioeconomic History   Marital status: Single    Spouse name: Not on file   Number of children: 0   Years of education: Not on file   Highest education level: Some college, no degree  Occupational History   Occupation: Runner, broadcasting/film/video, hairdresser  Tobacco Use   Smoking status: Every Day    Types: Cigarettes   Smokeless tobacco: Never   Tobacco comments:    14 cigarettes per day  Vaping Use   Vaping status: Former   Substances: Nicotine  Substance and Sexual Activity   Alcohol use: Yes    Alcohol/week: 14.0 standard drinks of alcohol    Types: 14 Standard drinks or equivalent per week    Comment: 2 DRINKS PER DAY   Drug use: Yes    Frequency: 4.0 times per week    Types: Marijuana    Comment: light usage   Sexual activity: Not on file  Other  Topics Concern   Not on file  Social History Narrative   Lives at home alone in [place].   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     Social alcohol consumption.    Smoked for many years - 1 PPD.   Social Drivers of Corporate investment banker Strain:  Low Risk  (03/29/2023)   Overall Financial Resource Strain (CARDIA)    Difficulty of Paying Living Expenses: Not hard at all  Food Insecurity: No Food Insecurity (03/14/2023)   Hunger Vital Sign    Worried About Running Out of Food in the Last Year: Never true    Ran Out of Food in the Last Year: Never true  Transportation Needs: No Transportation Needs (03/14/2023)   PRAPARE - Administrator, Civil Service (Medical): No    Lack of Transportation (Non-Medical): No  Physical Activity: Sufficiently Active (03/29/2023)   Exercise Vital Sign    Days of Exercise per Week: 5 days    Minutes of Exercise per Session: 30 min  Stress: Stress Concern Present (03/29/2023)   [employer] of Occupational Health - Occupational Stress Questionnaire    Feeling of Stress : Rather much  Social Connections: Moderately Isolated (03/29/2023)   Social Connection and Isolation Panel    Frequency of Communication with Friends and Family: More than three times a week    Frequency of Social Gatherings with Friends and Family: Twice a week    Attends Religious Services: Never    Database administrator or Organizations: Yes    Attends Banker Meetings: Never    Marital Status: Never married  Catering manager Violence: Not on file    Review of Systems:  All other review of systems negative except as mentioned in the HPI.  Physical Exam: Vital signs BP (!) 174/85   Pulse 97   Temp 98.2 F (36.8 C) (Temporal)   Ht 5' 5 (1.651 m)   Wt 131 lb (59.4 kg)   SpO2 99%   BMI 21.80 kg/m   General:   Alert,  Well-developed,  well-nourished, pleasant and cooperative in NAD Airway:  Mallampati 2 Lungs:  Clear throughout to auscultation.   Heart:  Regular rate and rhythm; no murmurs, clicks, rubs,  or gallops. Abdomen:  Soft, nontender and nondistended. Normal bowel sounds.   Neuro/Psych:  Normal mood and affect. A and O x 3   Zachory Mangual E. Stacia, MD [facility]

## 2023-08-17 ENCOUNTER — Telehealth: Payer: Self-pay | Admitting: *Deleted

## 2023-08-17 NOTE — Telephone Encounter (Signed)
 No answer after follow up call. Left a message.

## 2023-08-18 LAB — SURGICAL PATHOLOGY

## 2023-08-22 ENCOUNTER — Ambulatory Visit: Payer: Self-pay | Admitting: Gastroenterology

## 2023-08-22 NOTE — Progress Notes (Signed)
 Sean Weaver,  Two polyps which I removed during your recent procedure were proven to be completely benign but are considered pre-cancerous polyps that MAY have grown into cancer if they had not been removed. Two polyps were not precancerous. Studies shows that at least 20% of women over age 70 and 30% of men over age 36 have pre-cancerous polyps.  Based on your history of advanced polyps, I recommend that you have a repeat colonoscopy in 5 years.   If you develop any new rectal bleeding, abdominal pain or significant bowel habit changes, please contact me before then.

## 2023-08-29 ENCOUNTER — Other Ambulatory Visit: Payer: Self-pay | Admitting: Cardiology

## 2023-08-29 DIAGNOSIS — E7800 Pure hypercholesterolemia, unspecified: Secondary | ICD-10-CM

## 2023-08-29 DIAGNOSIS — I10 Essential (primary) hypertension: Secondary | ICD-10-CM

## 2023-08-29 DIAGNOSIS — I639 Cerebral infarction, unspecified: Secondary | ICD-10-CM

## 2023-09-15 ENCOUNTER — Other Ambulatory Visit: Payer: Self-pay

## 2023-09-15 ENCOUNTER — Encounter: Payer: Self-pay | Admitting: Internal Medicine

## 2023-09-15 MED ORDER — COVID-19 MRNA VACC (MODERNA) 50 MCG/0.5ML IM SUSP: 0.5000 mL | Freq: Once | INTRAMUSCULAR | 0 refills | Status: AC

## 2023-09-16 ENCOUNTER — Other Ambulatory Visit: Payer: Self-pay | Admitting: Internal Medicine

## 2023-09-20 ENCOUNTER — Other Ambulatory Visit

## 2023-09-20 DIAGNOSIS — E039 Hypothyroidism, unspecified: Secondary | ICD-10-CM

## 2023-09-20 DIAGNOSIS — E7800 Pure hypercholesterolemia, unspecified: Secondary | ICD-10-CM

## 2023-09-21 LAB — LIPID PANEL
Cholesterol: 162 mg/dL (ref <200)
HDL: 90 mg/dL (ref >=40)
LDL Cholesterol (Calc): 58 mg/dL
Non-HDL Cholesterol (Calc): 72 mg/dL (ref <130)
Total CHOL/HDL Ratio: 1.8 (calc) (ref <5.0)
Triglycerides: 67 mg/dL (ref <150)

## 2023-09-21 LAB — HEPATIC FUNCTION PANEL
AG Ratio: 1.8 (calc) (ref 1.0–2.5)
ALT: 16 U/L (ref 9–46)
AST: 18 U/L (ref 10–35)
Albumin: 4.5 g/dL (ref 3.6–5.1)
Alkaline phosphatase (APISO): 78 U/L (ref 35–144)
Bilirubin, Direct: 0.2 mg/dL (ref 0.0–0.2)
Globulin: 2.5 g/dL (ref 1.9–3.7)
Indirect Bilirubin: 1.1 mg/dL (ref 0.2–1.2)
Total Bilirubin: 1.3 mg/dL — ABNORMAL HIGH (ref 0.2–1.2)
Total Protein: 7 g/dL (ref 6.1–8.1)

## 2023-09-21 LAB — TSH: TSH: 0.97 m[IU]/L (ref 0.40–4.50)

## 2023-09-27 ENCOUNTER — Other Ambulatory Visit

## 2023-10-01 NOTE — Progress Notes (Addendum)
 Patient Care Team: Perri Ronal PARAS, MD as PCP - General (Internal Medicine) Ladona Heinz, MD as PCP - Cardiology (Cardiology)  Visit Date: 10/04/23  Subjective:    Patient ID: Sean Weaver , Male   DOB: April 10, 1955, 68 y.o.    MRN: [id number]   68 y.o. Male presents today for 6 month follow up. Patient has a past medical history of Psoriatic Arthritis,  embolic CVA, Hypertension, Hyperlipidemia, Hypothyroidism, Depression. Recent Lipid panel. Liver functions, TSH are all stable.  He says feels well. He says he is moving to [place] in a few months and will set up an appointment at a later date to discuss transferring his records.   History of Psoriatic Arthritis treated with Humira 40 mg every other week. Recently seen in this office for Staph infection of his hands bilaterally  associated with psoriasis flare-up and treated with Keflex  500 mg BID.    History of CVA treated with Plavix  75 mg daily. Had a cerebral infarction due to embolism of right posterior cerebral artery. Had loop recorder inserted several years ago, which is no longer working but he is electing to leave it in for now per Cardiology. Followed by Dr. Ines after episode of monocular vision loss.  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  Carotids were patent on MRI. Took Plavix  for a while but that has been discontinued.   History of Hypertension treated with Altace  10 mg daily. Blood pressure normal today at 120/70.Became hypertensive in early 2000.   History of Hyperlipidemia treated with Rosuvastatin  20 mg daily. 09/20/2023 Lipid Panel normal.    History of Hypothyroidism dx 1999 treated with Levothyroxine 100 mcg daily. 09/20/2023 TSH 0.97.   History of Depression treated with Wellbutrin  XL 150 mg daily.   History of Erectile Dysfunction treated with Viagra  50 mg as needed for Erectile dysfunction.   Hx of gastritis and duodenitis noted on  endoscopy 1996 improved with Prilosec.    Labs 09/20/2023 Total Bilirubin 1.3 Otherwise WNL   Vaccine counseling: UTD on Influenza Vaccine.   Past Medical History:  Diagnosis Date   COPD (chronic obstructive pulmonary disease) (HCC)    GERD (gastroesophageal reflux disease)    resolved per pt report   Hyperlipidemia    Hypertension    Hypothyroidism    Pneumonia    Psoriatic arthritis (HCC)    Stroke (cerebrum) (HCC)    Tubular adenoma      Family History  Problem Relation Age of Onset   Ovarian cancer Mother    Breast cancer Mother    Prostate cancer Father    Kidney failure Father    Colon polyps Father    Hypertension Father    Hyperlipidemia Father    Hypertension Sister    Non-Hodgkin's lymphoma Sister    COPD Maternal Grandfather    Migraines Neg Hx    Headache Neg Hx     Social History   Social History Narrative   Lives at home alone in [place].   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     Social alcohol consumption.    Smoked for many years - 1 PPD.      Review of Systems  All other systems reviewed and are negative.       Objective:   Vitals: BP 120/70   Pulse 82  Ht 5' 5 (1.651 m)   Wt 130 lb (59 kg)   SpO2 96%   BMI 21.63 kg/m    Physical Exam Vitals and nursing note reviewed.  Constitutional:      General: He is not in acute distress.    Appearance: Normal appearance. He is not ill-appearing.  HENT:     Head: Normocephalic and atraumatic.  Cardiovascular:     Rate and Rhythm: Normal rate and regular rhythm.     Pulses: Normal pulses.     Heart sounds: Normal heart sounds. No murmur heard.    No friction rub. No gallop.  Pulmonary:     Effort: Pulmonary effort is normal. No respiratory distress.     Breath sounds: Normal breath sounds. No wheezing or rales.  Musculoskeletal:     Right lower leg: No edema.     Left lower  leg: No edema.  Skin:    General: Skin is warm and dry.  Neurological:     Mental Status: He is alert and oriented to person, place, and time. Mental status is at baseline.  Psychiatric:        Mood and Affect: Mood normal.        Behavior: Behavior normal.        Thought Content: Thought content normal.        Judgment: Judgment normal.       Results:    Labs:       Component Value Date/Time   NA 138 03/22/2023 1234   NA 140 02/21/2018 0855   K 5.2 03/22/2023 1234   CL 102 03/22/2023 1234   CO2 26 03/22/2023 1234   GLUCOSE 85 03/22/2023 1234   BUN 15 03/22/2023 1234   BUN 17 02/21/2018 0855   CREATININE 1.16 03/22/2023 1234   CALCIUM  9.4 03/22/2023 1234   PROT 7.0 09/20/2023 1005   ALBUMIN 3.9 06/02/2016 1033   AST 18 09/20/2023 1005   ALT 16 09/20/2023 1005   ALKPHOS 89 06/02/2016 1033   BILITOT 1.3 (H) 09/20/2023 1005   GFRNONAA 58 (L) 02/19/2020 0918   GFRAA 67 02/19/2020 0918     Lab Results  Component Value Date   WBC 8.1 03/22/2023   HGB 15.8 03/22/2023   HCT 47.1 03/22/2023   MCV 93.6 03/22/2023   PLT 332 03/22/2023    Lab Results  Component Value Date   CHOL 162 09/20/2023   HDL 90 09/20/2023   LDLCALC 58 09/20/2023   TRIG 67 09/20/2023   CHOLHDL 1.8 09/20/2023    Lab Results  Component Value Date   HGBA1C 5.4 08/12/2020     Lab Results  Component Value Date   TSH 0.97 09/20/2023     Lab Results  Component Value Date   PSA 1.09 03/22/2023   PSA 1.02 03/16/2022   PSA 1.15 09/01/2021    Assessment & Plan:   Psoriatic Arthritis: treated with Humira 40 mg every other week. Recently seen in this office for Staph infection of his hands bilaterally  associated with psoriasis flare-up and treated with Keflex  500 mg BID.     CVA: treated with Plavix  75 mg daily. Had a cerebral infarction due to embolism of right posterior cerebral artery. Had loop recorder inserted several years ago, which is now longer working but he is electing to leave  it in for now per cardiology. Followed by Dr. Ines after episode of monocular vision loss.  MRI showed chronic right occipital ischemic infarction and mild chronic small vessel  ischemic disease.  Carotids were patent on MRI.   Hypertension: treated with Altace  10 mg daily. Blood pressure normal today at 120/70.   Hyperlipidemia: treated with Rosuvastatin  20 mg daily. 09/20/2023 Lipid Panel normal.    Hypothyroidism: treated with Levothyroxine 100 mcg daily. 09/20/2023 TSH 0.97.   Depression: treated with Wellbutrin  XL 150 mg daily.   Erectile Dysfunction: treated with Viagra  50 mg as needed for Erectile dysfunction.   Hx of adenomatous colon polyp- colonoscopy is up to date  Vaccine counseling: UTD on Influenza Vaccine.   I,Makayla C Reid,acting as a scribe for Ronal JINNY Hailstone, MD.,have documented all relevant documentation on the behalf of Ronal JINNY Hailstone, MD,as directed by  Ronal JINNY Hailstone, MD while in the presence of Ronal JINNY Hailstone, MD.   I, Ronal JINNY Hailstone, MD, have reviewed all documentation for this visit. The documentation on 10/04/2023 for the exam, diagnosis, procedures, and orders are all accurate and complete.

## 2023-10-04 ENCOUNTER — Ambulatory Visit: Admitting: Internal Medicine

## 2023-10-04 ENCOUNTER — Encounter: Payer: Self-pay | Admitting: Internal Medicine

## 2023-10-04 VITALS — BP 120/70 | HR 82 | Ht 65.0 in | Wt 130.0 lb

## 2023-10-04 DIAGNOSIS — Z87891 Personal history of nicotine dependence: Secondary | ICD-10-CM | POA: Diagnosis not present

## 2023-10-04 DIAGNOSIS — I10 Essential (primary) hypertension: Secondary | ICD-10-CM

## 2023-10-04 DIAGNOSIS — E039 Hypothyroidism, unspecified: Secondary | ICD-10-CM

## 2023-10-04 DIAGNOSIS — E7800 Pure hypercholesterolemia, unspecified: Secondary | ICD-10-CM

## 2023-10-04 DIAGNOSIS — Z872 Personal history of diseases of the skin and subcutaneous tissue: Secondary | ICD-10-CM

## 2023-10-04 DIAGNOSIS — Z8673 Personal history of transient ischemic attack (TIA), and cerebral infarction without residual deficits: Secondary | ICD-10-CM

## 2023-10-04 DIAGNOSIS — Z860101 Personal history of adenomatous and serrated colon polyps: Secondary | ICD-10-CM

## 2023-10-04 NOTE — Patient Instructions (Signed)
 It was a pleasure to see you today. Please continue current meds. We are happy to assist you in any way with transferring your medical records as [place] out of the country.

## 2023-11-15 ENCOUNTER — Encounter: Payer: Self-pay | Admitting: Internal Medicine

## 2023-11-15 ENCOUNTER — Other Ambulatory Visit: Payer: Self-pay

## 2023-11-15 DIAGNOSIS — I639 Cerebral infarction, unspecified: Secondary | ICD-10-CM

## 2023-11-15 DIAGNOSIS — I10 Essential (primary) hypertension: Secondary | ICD-10-CM

## 2023-11-15 DIAGNOSIS — E7800 Pure hypercholesterolemia, unspecified: Secondary | ICD-10-CM

## 2023-11-15 MED ORDER — EZETIMIBE 10 MG PO TABS: 10.0000 mg | ORAL_TABLET | Freq: Every day | ORAL | 0 refills | Status: DC

## 2023-11-15 MED ORDER — DILTIAZEM HCL ER COATED BEADS 180 MG PO CP24: 180.0000 mg | ORAL_CAPSULE | Freq: Every day | ORAL | 0 refills | Status: DC

## 2023-11-15 MED ORDER — SYNTHROID 100 MCG PO TABS: 100.0000 ug | ORAL_TABLET | Freq: Every day | ORAL | 0 refills | Status: DC

## 2023-11-15 MED ORDER — CLOPIDOGREL BISULFATE 75 MG PO TABS: 75.0000 mg | ORAL_TABLET | Freq: Every day | ORAL | 0 refills | Status: DC

## 2023-11-15 MED ORDER — BUPROPION HCL ER (XL) 150 MG PO TB24: 150.0000 mg | ORAL_TABLET | Freq: Every day | ORAL | 0 refills | Status: AC

## 2023-11-15 MED ORDER — RAMIPRIL 10 MG PO CAPS: 10.0000 mg | ORAL_CAPSULE | Freq: Every day | ORAL | 0 refills | Status: DC

## 2023-11-18 ENCOUNTER — Other Ambulatory Visit: Payer: Self-pay

## 2023-11-18 DIAGNOSIS — E7800 Pure hypercholesterolemia, unspecified: Secondary | ICD-10-CM

## 2023-11-18 DIAGNOSIS — I10 Essential (primary) hypertension: Secondary | ICD-10-CM

## 2023-11-18 DIAGNOSIS — I639 Cerebral infarction, unspecified: Secondary | ICD-10-CM

## 2023-11-18 MED ORDER — RAMIPRIL 10 MG PO CAPS: 10.0000 mg | ORAL_CAPSULE | Freq: Every day | ORAL | 3 refills | Status: AC

## 2023-11-18 MED ORDER — DILTIAZEM HCL ER COATED BEADS 180 MG PO CP24: 180.0000 mg | ORAL_CAPSULE | Freq: Every day | ORAL | 3 refills | Status: AC

## 2023-11-18 MED ORDER — ROSUVASTATIN CALCIUM 20 MG PO TABS: 20.0000 mg | ORAL_TABLET | Freq: Every day | ORAL | 3 refills | Status: DC

## 2023-11-18 MED ORDER — SYNTHROID 100 MCG PO TABS: 100.0000 ug | ORAL_TABLET | Freq: Every day | ORAL | 3 refills | Status: AC

## 2023-11-18 MED ORDER — ROSUVASTATIN CALCIUM 20 MG PO TABS: 20.0000 mg | ORAL_TABLET | Freq: Every day | ORAL | 3 refills | Status: AC

## 2023-11-18 MED ORDER — CLOPIDOGREL BISULFATE 75 MG PO TABS
75.0000 mg | ORAL_TABLET | Freq: Every day | ORAL | 3 refills | Status: AC
Start: 2023-11-18 — End: ?

## 2023-11-18 MED ORDER — EZETIMIBE 10 MG PO TABS: 10.0000 mg | ORAL_TABLET | Freq: Every day | ORAL | 3 refills | Status: AC

## 2023-11-18 NOTE — Addendum Note (Signed)
 Addended by: Calahan Pak P on: 11/18/2023 03:20 PM   Modules accepted: Orders

## 2023-11-22 NOTE — Telephone Encounter (Signed)
 I can print rx with dates.

## 2023-11-25 ENCOUNTER — Telehealth: Payer: Self-pay | Admitting: Internal Medicine

## 2023-11-30 ENCOUNTER — Encounter: Payer: Self-pay | Admitting: Internal Medicine

## 2023-12-01 ENCOUNTER — Encounter: Payer: Self-pay | Admitting: Internal Medicine

## 2023-12-11 ENCOUNTER — Other Ambulatory Visit: Payer: Self-pay | Admitting: Internal Medicine

## 2023-12-14 ENCOUNTER — Telehealth: Payer: Self-pay | Admitting: Internal Medicine

## 2023-12-17 NOTE — Telephone Encounter (Signed)
 done

## 2023-12-20 NOTE — Telephone Encounter (Signed)
 done

## 2024-02-02 ENCOUNTER — Encounter: Payer: Self-pay | Admitting: Internal Medicine
